# Patient Record
Sex: Female | Born: 1950
Health system: Southern US, Community
[De-identification: ages and names within clinical notes are randomized; demographics above are authoritative.]

## PROBLEM LIST (undated history)

## (undated) DIAGNOSIS — E785 Hyperlipidemia, unspecified: Secondary | ICD-10-CM

## (undated) DIAGNOSIS — I10 Essential (primary) hypertension: Secondary | ICD-10-CM

## (undated) DIAGNOSIS — Z972 Presence of dental prosthetic device (complete) (partial): Secondary | ICD-10-CM

## (undated) DIAGNOSIS — M1990 Unspecified osteoarthritis, unspecified site: Secondary | ICD-10-CM

## (undated) DIAGNOSIS — E039 Hypothyroidism, unspecified: Secondary | ICD-10-CM

## (undated) DIAGNOSIS — R42 Dizziness and giddiness: Secondary | ICD-10-CM

## (undated) HISTORY — DX: Essential (primary) hypertension: I10

## (undated) HISTORY — DX: Hyperlipidemia, unspecified: E78.5

## (undated) HISTORY — PX: BREAST CYST ASPIRATION: SHX578

## (undated) HISTORY — PX: DENTAL SURGERY: SHX609

---

## 2000-08-31 HISTORY — PX: BREAST EXCISIONAL BIOPSY: SUR124

## 2011-07-21 ENCOUNTER — Ambulatory Visit: Payer: Self-pay | Admitting: Family Medicine

## 2012-04-04 DIAGNOSIS — I10 Essential (primary) hypertension: Secondary | ICD-10-CM | POA: Insufficient documentation

## 2012-04-04 DIAGNOSIS — E7800 Pure hypercholesterolemia, unspecified: Secondary | ICD-10-CM | POA: Insufficient documentation

## 2012-04-04 DIAGNOSIS — G5600 Carpal tunnel syndrome, unspecified upper limb: Secondary | ICD-10-CM | POA: Insufficient documentation

## 2013-06-21 ENCOUNTER — Ambulatory Visit: Payer: Self-pay | Admitting: Physician Assistant

## 2013-08-03 ENCOUNTER — Ambulatory Visit: Payer: Self-pay | Admitting: Physician Assistant

## 2013-10-11 ENCOUNTER — Ambulatory Visit: Payer: Self-pay | Admitting: Physician Assistant

## 2014-02-06 ENCOUNTER — Ambulatory Visit: Payer: Self-pay | Admitting: Physician Assistant

## 2014-08-27 ENCOUNTER — Ambulatory Visit: Payer: Self-pay | Admitting: Physician Assistant

## 2015-08-25 IMAGING — MG MM DIGITAL SCREENING BILAT W/ CAD
1 series · 4 of 4 positions shown · non-contrast
Comparison: Previous Exam(s)

REASON FOR EXAM: SCR MAMMO NO ORDER
COMMENTS:

PROCEDURE:     MAM - MAM DGTL SCRN MAM NO ORDER W/CAD  - June 21, 2013  [DATE]
CLINICAL DATA: Screening.
EXAM:
DIGITAL SCREENING BILATERAL MAMMOGRAM WITH CAD

[R CC · right · 4 of 4 slices shown]
[im 1/4]
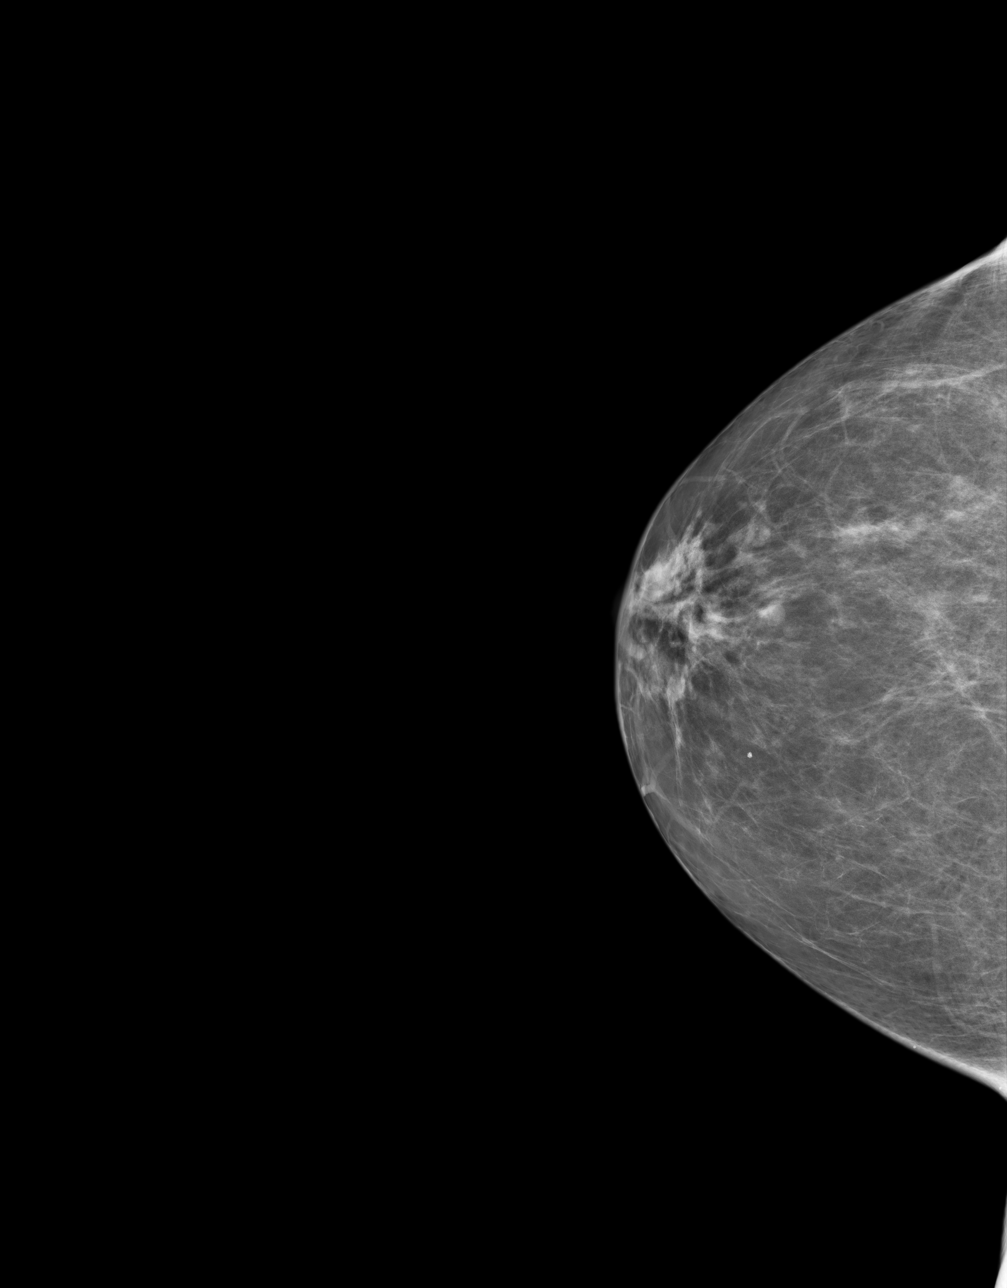
[im 2/4]
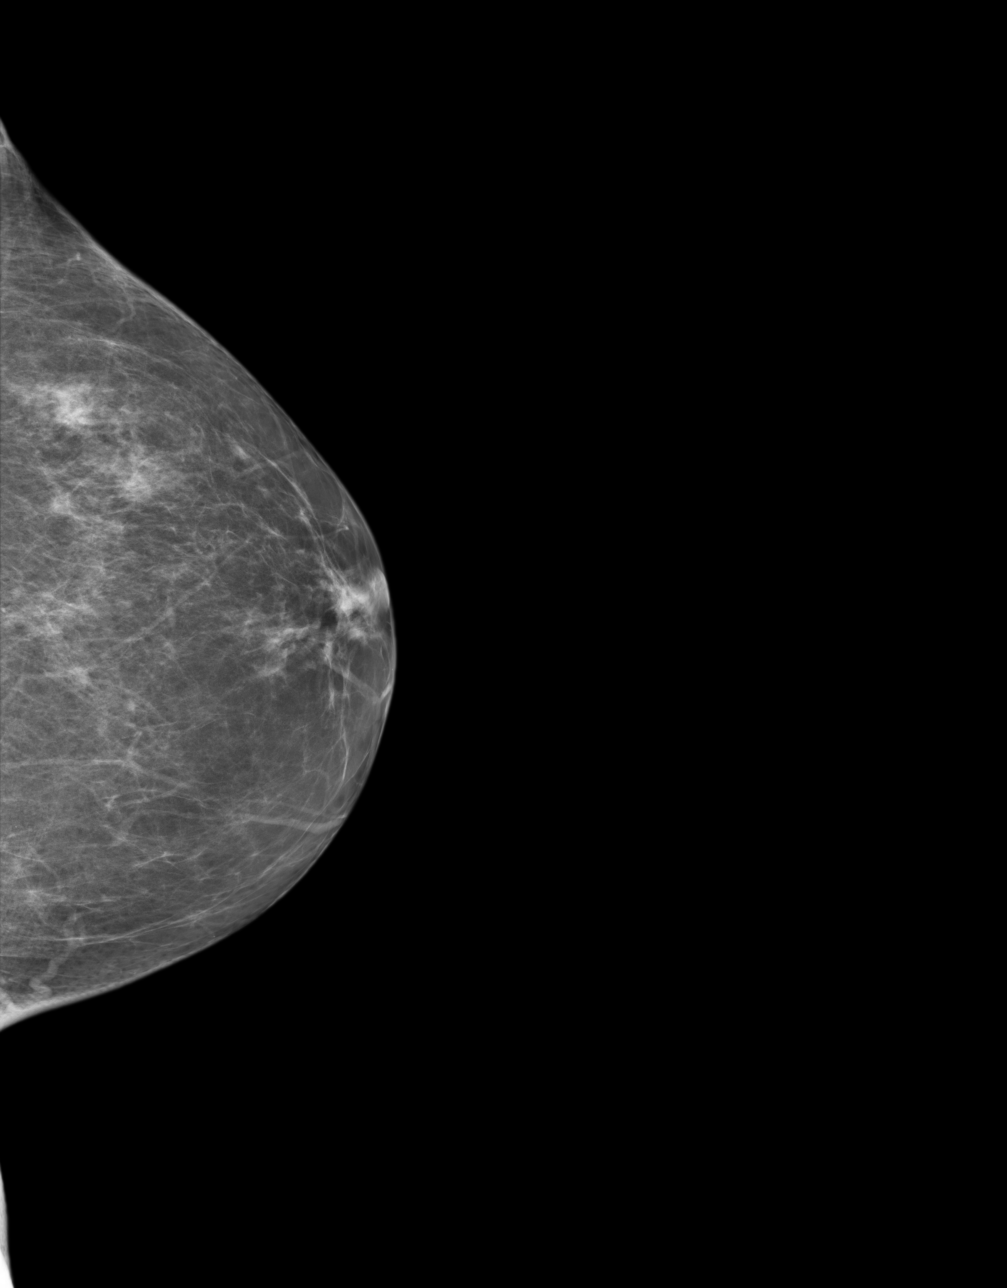
[im 3/4]
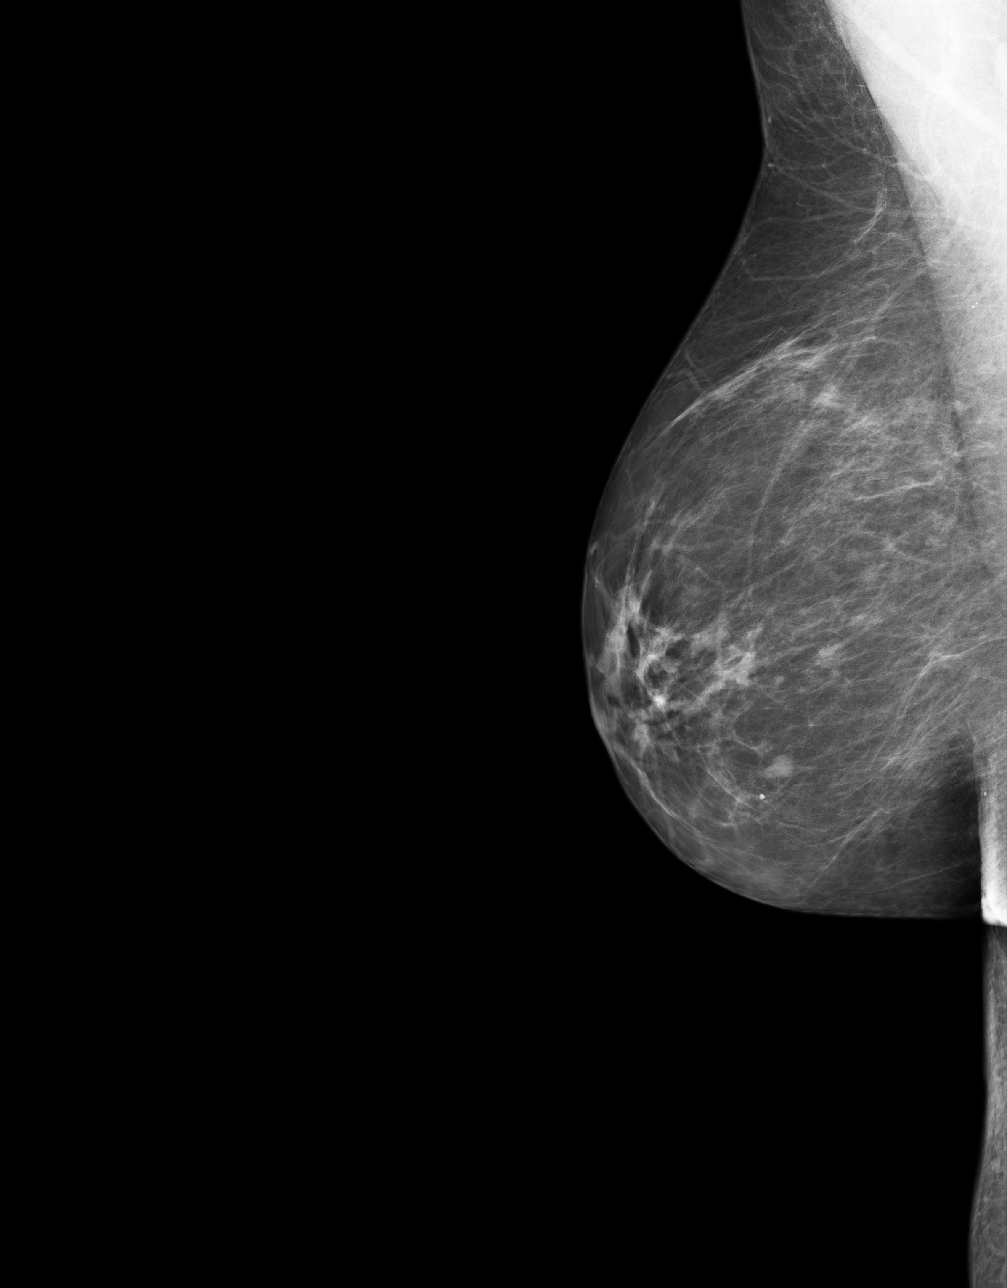
[im 4/4]
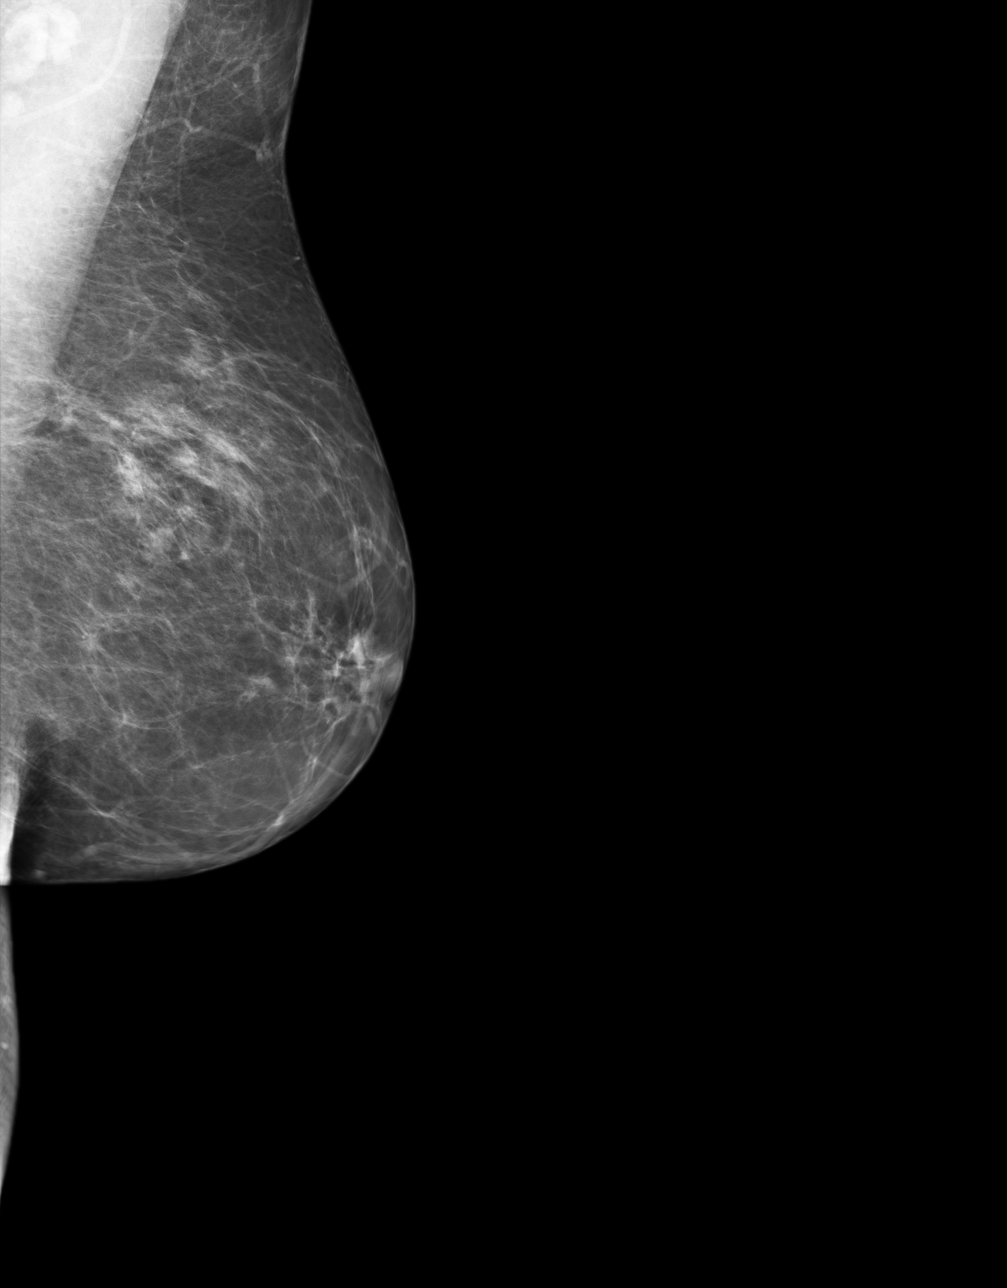

[4 of 4 positions shown; findings below may reference images not displayed]

ACR Breast Density Category b: There are scattered areas of
fibroglandular density.
FINDINGS: In the left breast possible mass warrants further imaging evaluation
with spot compression views and possibly ultrasound. In the right
breast, possible mass warrants further imaging evaluation with spot
compression views and possibly ultrasound. Images were processed
with CAD.
IMPRESSION: Further imaging evaluation is suggested for possible Paulus N the left
breast.

Further imaging evaluation is suggested for possible Paulus N the
right breast.

RECOMMENDATION:
Diagnostic mammogram and possibly ultrasound of both breasts.
(Code:[id number])

The patient will be contacted regarding the findings, and additional
imaging will be scheduled.

BI-RADS CATEGORY  0: Incomplete. Need additional imaging evaluation
and/or prior mammograms for comparison.

## 2016-07-02 DIAGNOSIS — I10 Essential (primary) hypertension: Secondary | ICD-10-CM | POA: Diagnosis not present

## 2016-07-02 DIAGNOSIS — R635 Abnormal weight gain: Secondary | ICD-10-CM | POA: Diagnosis not present

## 2016-07-02 DIAGNOSIS — J0110 Acute frontal sinusitis, unspecified: Secondary | ICD-10-CM | POA: Diagnosis not present

## 2016-07-02 DIAGNOSIS — R42 Dizziness and giddiness: Secondary | ICD-10-CM | POA: Diagnosis not present

## 2016-08-06 DIAGNOSIS — Z6837 Body mass index (BMI) 37.0-37.9, adult: Secondary | ICD-10-CM | POA: Diagnosis not present

## 2016-08-06 DIAGNOSIS — E782 Mixed hyperlipidemia: Secondary | ICD-10-CM | POA: Diagnosis not present

## 2016-08-06 DIAGNOSIS — I10 Essential (primary) hypertension: Secondary | ICD-10-CM | POA: Diagnosis not present

## 2016-10-05 DIAGNOSIS — E669 Obesity, unspecified: Secondary | ICD-10-CM | POA: Diagnosis not present

## 2016-10-05 DIAGNOSIS — I10 Essential (primary) hypertension: Secondary | ICD-10-CM | POA: Diagnosis not present

## 2016-10-05 DIAGNOSIS — E782 Mixed hyperlipidemia: Secondary | ICD-10-CM | POA: Diagnosis not present

## 2016-12-01 DIAGNOSIS — E782 Mixed hyperlipidemia: Secondary | ICD-10-CM | POA: Diagnosis not present

## 2016-12-01 DIAGNOSIS — Z0001 Encounter for general adult medical examination with abnormal findings: Secondary | ICD-10-CM | POA: Diagnosis not present

## 2016-12-01 DIAGNOSIS — E559 Vitamin D deficiency, unspecified: Secondary | ICD-10-CM | POA: Diagnosis not present

## 2016-12-01 DIAGNOSIS — I10 Essential (primary) hypertension: Secondary | ICD-10-CM | POA: Diagnosis not present

## 2016-12-07 ENCOUNTER — Other Ambulatory Visit: Payer: Self-pay | Admitting: Nurse Practitioner

## 2016-12-07 DIAGNOSIS — R87610 Atypical squamous cells of undetermined significance on cytologic smear of cervix (ASC-US): Secondary | ICD-10-CM | POA: Diagnosis not present

## 2016-12-07 DIAGNOSIS — E782 Mixed hyperlipidemia: Secondary | ICD-10-CM | POA: Diagnosis not present

## 2016-12-07 DIAGNOSIS — R10811 Right upper quadrant abdominal tenderness: Secondary | ICD-10-CM | POA: Diagnosis not present

## 2016-12-07 DIAGNOSIS — Z124 Encounter for screening for malignant neoplasm of cervix: Secondary | ICD-10-CM | POA: Diagnosis not present

## 2016-12-07 DIAGNOSIS — Z0001 Encounter for general adult medical examination with abnormal findings: Secondary | ICD-10-CM | POA: Diagnosis not present

## 2016-12-07 DIAGNOSIS — Z1231 Encounter for screening mammogram for malignant neoplasm of breast: Secondary | ICD-10-CM

## 2016-12-07 DIAGNOSIS — E669 Obesity, unspecified: Secondary | ICD-10-CM | POA: Diagnosis not present

## 2016-12-07 DIAGNOSIS — E039 Hypothyroidism, unspecified: Secondary | ICD-10-CM | POA: Diagnosis not present

## 2016-12-07 DIAGNOSIS — I10 Essential (primary) hypertension: Secondary | ICD-10-CM | POA: Diagnosis not present

## 2016-12-31 ENCOUNTER — Ambulatory Visit
Admission: RE | Admit: 2016-12-31 | Discharge: 2016-12-31 | Disposition: A | Discharge status: Home / Self Care | Payer: PPO | Source: Ambulatory Visit | Attending: Nurse Practitioner | Admitting: Nurse Practitioner

## 2016-12-31 ENCOUNTER — Encounter: Payer: Self-pay | Admitting: Radiology

## 2016-12-31 DIAGNOSIS — Z1231 Encounter for screening mammogram for malignant neoplasm of breast: Secondary | ICD-10-CM

## 2017-03-11 DIAGNOSIS — E039 Hypothyroidism, unspecified: Secondary | ICD-10-CM | POA: Diagnosis not present

## 2017-03-18 DIAGNOSIS — E669 Obesity, unspecified: Secondary | ICD-10-CM | POA: Diagnosis not present

## 2017-03-18 DIAGNOSIS — E782 Mixed hyperlipidemia: Secondary | ICD-10-CM | POA: Diagnosis not present

## 2017-03-18 DIAGNOSIS — I10 Essential (primary) hypertension: Secondary | ICD-10-CM | POA: Diagnosis not present

## 2017-03-18 DIAGNOSIS — E039 Hypothyroidism, unspecified: Secondary | ICD-10-CM | POA: Diagnosis not present

## 2017-03-18 DIAGNOSIS — R05 Cough: Secondary | ICD-10-CM | POA: Diagnosis not present

## 2017-06-09 ENCOUNTER — Ambulatory Visit
Admission: RE | Admit: 2017-06-09 | Discharge: 2017-06-09 | Disposition: A | Discharge status: Home / Self Care | Payer: PPO | Source: Ambulatory Visit | Attending: Internal Medicine | Admitting: Internal Medicine

## 2017-06-09 ENCOUNTER — Other Ambulatory Visit: Payer: Self-pay | Admitting: Internal Medicine

## 2017-06-09 DIAGNOSIS — J849 Interstitial pulmonary disease, unspecified: Secondary | ICD-10-CM | POA: Insufficient documentation

## 2017-06-09 DIAGNOSIS — R05 Cough: Secondary | ICD-10-CM | POA: Diagnosis not present

## 2017-06-09 DIAGNOSIS — R059 Cough, unspecified: Secondary | ICD-10-CM

## 2017-06-09 DIAGNOSIS — E669 Obesity, unspecified: Secondary | ICD-10-CM | POA: Diagnosis not present

## 2017-06-09 DIAGNOSIS — I10 Essential (primary) hypertension: Secondary | ICD-10-CM | POA: Diagnosis not present

## 2017-06-09 DIAGNOSIS — K219 Gastro-esophageal reflux disease without esophagitis: Secondary | ICD-10-CM | POA: Diagnosis not present

## 2017-06-09 DIAGNOSIS — E039 Hypothyroidism, unspecified: Secondary | ICD-10-CM | POA: Diagnosis not present

## 2017-06-29 ENCOUNTER — Other Ambulatory Visit: Payer: Self-pay | Admitting: Nurse Practitioner

## 2017-06-29 DIAGNOSIS — R918 Other nonspecific abnormal finding of lung field: Secondary | ICD-10-CM

## 2017-07-05 ENCOUNTER — Ambulatory Visit: Payer: PPO

## 2017-09-07 ENCOUNTER — Other Ambulatory Visit: Payer: Self-pay

## 2017-09-07 ENCOUNTER — Ambulatory Visit: Payer: Self-pay | Admitting: Nurse Practitioner

## 2017-09-07 MED ORDER — OMEPRAZOLE 40 MG PO CPDR: 40.0000 mg | DELAYED_RELEASE_CAPSULE | Freq: Every day | ORAL | 3 refills | Status: DC

## 2017-10-08 ENCOUNTER — Encounter: Payer: Self-pay | Admitting: Nurse Practitioner

## 2017-10-08 ENCOUNTER — Ambulatory Visit (INDEPENDENT_AMBULATORY_CARE_PROVIDER_SITE_OTHER): Payer: PPO | Admitting: Nurse Practitioner

## 2017-10-08 VITALS — BP 135/78 | HR 72 | Resp 16 | Ht 61.5 in | Wt 206.0 lb

## 2017-10-08 DIAGNOSIS — J302 Other seasonal allergic rhinitis: Secondary | ICD-10-CM | POA: Diagnosis not present

## 2017-10-08 DIAGNOSIS — E039 Hypothyroidism, unspecified: Secondary | ICD-10-CM

## 2017-10-08 DIAGNOSIS — E668 Other obesity: Secondary | ICD-10-CM

## 2017-10-08 DIAGNOSIS — E7800 Pure hypercholesterolemia, unspecified: Secondary | ICD-10-CM

## 2017-10-08 DIAGNOSIS — E669 Obesity, unspecified: Secondary | ICD-10-CM

## 2017-10-08 MED ORDER — PHENTERMINE HCL 37.5 MG PO TABS: 37.5000 mg | ORAL_TABLET | Freq: Every day | ORAL | 1 refills | Status: DC

## 2017-10-08 NOTE — Progress Notes (Signed)
[facility] [place], [place] [address]  Internal MEDICINE  Office Visit Note  Patient Name: Savannah Franco  [id number]  [id number]  Date of Service: 10/17/2017  Chief Complaint  Patient presents with  . Allergic Rhinitis     Other  This is a recurrent problem. The current episode started more than 1 month ago. The problem occurs constantly. The problem has been unchanged. Associated symptoms include headaches. Pertinent negatives include no abdominal pain, chest pain, chills, congestion, coughing, fatigue, joint swelling, nausea, neck pain, numbness, rash, sore throat or vomiting. Treatments tried: stimulant weight loss medication, reduced calorie diet, and increased exercise. The treatment provided mild relief.    Pt is here for routine follow up.    Current Medication: Outpatient Encounter Medications as of 10/08/2017  Medication Sig  . aspirin 81 MG EC tablet Take 81 mg by mouth daily. Swallow whole.  Marland Kitchen atorvastatin (LIPITOR) 20 MG tablet Take by mouth.  Marland Kitchen omeprazole (PRILOSEC) 40 MG capsule Take 1 capsule (40 mg total) by mouth daily.  . B Complex Vitamins (VITAMIN B COMPLEX PO) Take by mouth.  . levothyroxine (SYNTHROID, LEVOTHROID) 25 MCG tablet TK 1 T PO D OES  . losartan-hydrochlorothiazide (HYZAAR) 50-12.5 MG tablet TK 1 T PO D  . phentermine (ADIPEX-P) 37.5 MG tablet Take 1 tablet (37.5 mg total) by mouth daily before breakfast.   No facility-administered encounter medications on file as of 10/08/2017.     Surgical History: Past Surgical History:  Procedure Laterality Date  . BREAST CYST ASPIRATION Right    NEG  . BREAST EXCISIONAL BIOPSY Left 2002   NEG    Medical History: Past Medical History:  Diagnosis Date  . Hyperlipidemia   . Hypertension     Family History: Family History  Problem Relation Age of Onset  . Multiple myeloma Mother   . Stroke Father   . Hypertension Father   . Breast cancer Neg Hx     Social History    Socioeconomic History  . Marital status: Divorced    Spouse name: Not on file  . Number of children: Not on file  . Years of education: Not on file  . Highest education level: Not on file  Social Needs  . Financial resource strain: Not on file  . Food insecurity - worry: Not on file  . Food insecurity - inability: Not on file  . Transportation needs - medical: Not on file  . Transportation needs - non-medical: Not on file  Occupational History  . Not on file  Tobacco Use  . Smoking status: Never Smoker  . Smokeless tobacco: Never Used  Substance and Sexual Activity  . Alcohol use: Yes    Comment: social  . Drug use: No  . Sexual activity: Not on file  Other Topics Concern  . Not on file  Social History Narrative  . Not on file      Review of Systems  Constitutional: Negative for activity change, chills, fatigue and unexpected weight change.  HENT: Positive for postnasal drip. Negative for congestion, rhinorrhea, sneezing and sore throat.   Eyes: Negative.  Negative for redness.  Respiratory: Negative for cough, chest tightness, shortness of breath and wheezing.   Cardiovascular: Negative for chest pain and palpitations.  Gastrointestinal: Negative for abdominal pain, constipation, diarrhea, nausea and vomiting.  Endocrine:       Well regulated thyroid   Genitourinary: Negative.  Negative for dysuria and frequency.  Musculoskeletal: Negative for back pain, joint  swelling and neck pain.  Skin: Negative for rash.  Allergic/Immunologic: Positive for environmental allergies.  Neurological: Positive for headaches. Negative for tremors and numbness.  Hematological: Negative for adenopathy. Does not bruise/bleed easily.  Psychiatric/Behavioral: Negative for agitation, behavioral problems (Depression), sleep disturbance and suicidal ideas.    Today's Vitals   10/08/17 1146  BP: 135/78  Pulse: 72  Resp: 16  SpO2: 97%  Weight: 206 lb (93.4 kg)  Height: 5' 1.5" (1.562  m)    Physical Exam  Constitutional: She is oriented to person, place, and time. She appears well-developed and well-nourished. No distress.  HENT:  Head: Normocephalic and atraumatic.  Mouth/Throat: Oropharynx is clear and moist. No oropharyngeal exudate.  Eyes: EOM are normal. Pupils are equal, round, and reactive to light.  Neck: Normal range of motion. Neck supple. No JVD present. No tracheal deviation present. No thyromegaly present.  Cardiovascular: Normal rate, regular rhythm and normal heart sounds. Exam reveals no gallop and no friction rub.  No murmur heard. Pulmonary/Chest: Effort normal and breath sounds normal. No respiratory distress. She has no wheezes. She has no rales. She exhibits no tenderness.  Abdominal: Soft. Bowel sounds are normal. There is no tenderness.  Musculoskeletal: Normal range of motion.  Lymphadenopathy:    She has no cervical adenopathy.  Neurological: She is alert and oriented to person, place, and time. No cranial nerve deficit.  Skin: Skin is warm and dry. She is not diaphoretic.  Psychiatric: She has a normal mood and affect. Her behavior is normal. Judgment and thought content normal.  Nursing note and vitals reviewed.    Assessment/Plan: 1. Moderate obesity Restart phentermine 37.5ng daily. recommennd 1200-1500 calorie diet. Also recommend participation in routine exercise program.  - phentermine (ADIPEX-P) 37.5 MG tablet; Take 1 tablet (37.5 mg total) by mouth daily before breakfast.  Dispense: 30 tablet; Refill: 1  2. Hypercholesterolemia genrally well controlled. Continue cholesterol medication as prescribed.   3. Acquired hypothyroidism Continue levothyroxine as prescribed.   4. Seasonal allergic rhinitis, unspecified trigger Recommended use of OTC claritin or zyrtec every day. If causes fatigue, she should take this at night.    General Counseling: augustina braddock understanding of the findings of todays visit and agrees with plan  of treatment. I have discussed any further diagnostic evaluation that may be needed or ordered today. We also reviewed her medications today. she has been encouraged to call the office with any questions or concerns that should arise related to todays visit.  This patient was seen by Leretha Pol, FNP- C in Collaboration with Dr Lavera Guise as a part of collaborative care agreement   There is a liability release in patients' chart. There has been a 10 minute discussion about the side effects including but not limited to elevated blood pressure, anxiety, lack of sleep and dry mouth. Pt understands and will like to start/continue on appetite suppressant at this time. There will be one month RX given at the time of visit with proper follow up. Nova diet plan with restricted calories is given to the pt. Pt understands and agrees with  plan of treatment  Meds ordered this encounter  Medications  . phentermine (ADIPEX-P) 37.5 MG tablet    Sig: Take 1 tablet (37.5 mg total) by mouth daily before breakfast.    Dispense:  30 tablet    Refill:  1    Order Specific Question:   Supervising Provider    Answer:   Lavera Guise [place]  Time spent: 43 Minutes     Dr Lavera Guise Internal medicine

## 2017-10-17 DIAGNOSIS — E039 Hypothyroidism, unspecified: Secondary | ICD-10-CM | POA: Insufficient documentation

## 2017-10-17 DIAGNOSIS — E668 Other obesity: Secondary | ICD-10-CM | POA: Insufficient documentation

## 2017-10-17 DIAGNOSIS — J302 Other seasonal allergic rhinitis: Secondary | ICD-10-CM | POA: Insufficient documentation

## 2017-11-26 ENCOUNTER — Other Ambulatory Visit: Payer: Self-pay | Admitting: Internal Medicine

## 2017-11-26 ENCOUNTER — Ambulatory Visit: Payer: Self-pay | Admitting: Nurse Practitioner

## 2017-11-26 ENCOUNTER — Encounter: Payer: Self-pay | Admitting: Nurse Practitioner

## 2017-11-26 ENCOUNTER — Ambulatory Visit (INDEPENDENT_AMBULATORY_CARE_PROVIDER_SITE_OTHER): Payer: PPO | Admitting: Nurse Practitioner

## 2017-11-26 VITALS — BP 140/77 | HR 75 | Resp 16 | Ht 60.0 in | Wt 203.0 lb

## 2017-11-26 DIAGNOSIS — E668 Other obesity: Secondary | ICD-10-CM | POA: Diagnosis not present

## 2017-11-26 DIAGNOSIS — J302 Other seasonal allergic rhinitis: Secondary | ICD-10-CM

## 2017-11-26 DIAGNOSIS — I10 Essential (primary) hypertension: Secondary | ICD-10-CM

## 2017-11-26 DIAGNOSIS — E669 Obesity, unspecified: Secondary | ICD-10-CM

## 2017-11-26 DIAGNOSIS — E039 Hypothyroidism, unspecified: Secondary | ICD-10-CM

## 2017-11-26 MED ORDER — PHENTERMINE HCL 37.5 MG PO TABS: 37.5000 mg | ORAL_TABLET | Freq: Every day | ORAL | 1 refills | Status: DC

## 2017-11-26 NOTE — Progress Notes (Signed)
[facility] [place], [place] [address]  Internal MEDICINE  Office Visit Note  Patient Name: Savannah Franco  [id number]  [id number]  Date of Service: 11/26/2017  Chief Complaint  Patient presents with  . Follow-up    weight management program.     The patient is here for routine follow up visit. She is currently taking phentermine 37.79m tablets to help her reduce appetite. She is exercising more frequently and is doing well with diet control. She reports no negative side effects from taking the phentermine   Pt is here for routine follow up.    Current Medication: Outpatient Encounter Medications as of 11/26/2017  Medication Sig  . aspirin 81 MG EC tablet Take 81 mg by mouth daily. Swallow whole.  .Marland Kitchenatorvastatin (LIPITOR) 20 MG tablet Take by mouth.  . B Complex Vitamins (VITAMIN B COMPLEX PO) Take by mouth.  . cholecalciferol (VITAMIN D) 1000 units tablet Take by mouth daily.  .Marland Kitchenlevothyroxine (SYNTHROID, LEVOTHROID) 25 MCG tablet TK 1 T PO D OES  . losartan-hydrochlorothiazide (HYZAAR) 50-12.5 MG tablet TK 1 T PO D  . phentermine (ADIPEX-P) 37.5 MG tablet Take 1 tablet (37.5 mg total) by mouth daily before breakfast.  . [DISCONTINUED] phentermine (ADIPEX-P) 37.5 MG tablet Take 1 tablet (37.5 mg total) by mouth daily before breakfast.  . omeprazole (PRILOSEC) 40 MG capsule Take 1 capsule (40 mg total) by mouth daily. (Patient not taking: Reported on 11/26/2017)   No facility-administered encounter medications on file as of 11/26/2017.     Surgical History: Past Surgical History:  Procedure Laterality Date  . BREAST CYST ASPIRATION Right    NEG  . BREAST EXCISIONAL BIOPSY Left 2002   NEG    Medical History: Past Medical History:  Diagnosis Date  . Hyperlipidemia   . Hypertension     Family History: Family History  Problem Relation Age of Onset  . Multiple myeloma Mother   . Stroke Father   . Hypertension Father   . Breast cancer Neg  Hx     Social History   Socioeconomic History  . Marital status: Divorced    Spouse name: Not on file  . Number of children: Not on file  . Years of education: Not on file  . Highest education level: Not on file  Occupational History  . Not on file  Social Needs  . Financial resource strain: Not on file  . Food insecurity:    Worry: Not on file    Inability: Not on file  . Transportation needs:    Medical: Not on file    Non-medical: Not on file  Tobacco Use  . Smoking status: Former SResearch scientist (life sciences) . Smokeless tobacco: Never Used  Substance and Sexual Activity  . Alcohol use: Yes    Comment: social  . Drug use: No  . Sexual activity: Not on file  Lifestyle  . Physical activity:    Days per week: Not on file    Minutes per session: Not on file  . Stress: Not on file  Relationships  . Social connections:    Talks on phone: Not on file    Gets together: Not on file    Attends religious service: Not on file    Active member of club or organization: Not on file    Attends meetings of clubs or organizations: Not on file    Relationship status: Not on file  . Intimate partner violence:    Fear of current  or ex partner: Not on file    Emotionally abused: Not on file    Physically abused: Not on file    Forced sexual activity: Not on file  Other Topics Concern  . Not on file  Social History Narrative  . Not on file      Review of Systems  Constitutional: Negative for activity change, chills, fatigue and unexpected weight change.       Three pound weight loss since her last visit .  HENT: Positive for postnasal drip. Negative for congestion, rhinorrhea, sneezing and sore throat.   Eyes: Negative.  Negative for redness.  Respiratory: Negative for cough, chest tightness, shortness of breath and wheezing.   Cardiovascular: Negative for chest pain and palpitations.  Gastrointestinal: Negative for abdominal pain, constipation, diarrhea, nausea and vomiting.  Endocrine:        Well regulated thyroid   Genitourinary: Negative.  Negative for dysuria and frequency.  Musculoskeletal: Negative for arthralgias, back pain, joint swelling and neck pain.  Skin: Negative for rash.  Allergic/Immunologic: Positive for environmental allergies.  Neurological: Negative for tremors, numbness and headaches.  Hematological: Negative for adenopathy. Does not bruise/bleed easily.  Psychiatric/Behavioral: Negative for agitation, behavioral problems (Depression), sleep disturbance and suicidal ideas.    Today's Vitals   11/26/17 1051  BP: 140/77  Pulse: 75  Resp: 16  SpO2: 97%  Weight: 203 lb (92.1 kg)  Height: 5' (1.524 m)    Physical Exam  Constitutional: She is oriented to person, place, and time. She appears well-developed and well-nourished. No distress.  HENT:  Head: Normocephalic and atraumatic.  Mouth/Throat: Oropharynx is clear and moist. No oropharyngeal exudate.  Eyes: Pupils are equal, round, and reactive to light. EOM are normal.  Neck: Normal range of motion. Neck supple. No JVD present. No tracheal deviation present. No thyromegaly present.  Cardiovascular: Normal rate, regular rhythm and normal heart sounds. Exam reveals no gallop and no friction rub.  No murmur heard. Pulmonary/Chest: Effort normal and breath sounds normal. No respiratory distress. She has no wheezes. She has no rales. She exhibits no tenderness.  Abdominal: Soft. Bowel sounds are normal. There is no tenderness.  Musculoskeletal: Normal range of motion.  Lymphadenopathy:    She has no cervical adenopathy.  Neurological: She is alert and oriented to person, place, and time. No cranial nerve deficit.  Skin: Skin is warm and dry. She is not diaphoretic.  Psychiatric: She has a normal mood and affect. Her behavior is normal. Judgment and thought content normal.  Nursing note and vitals reviewed.  Assessment/Plan:  1. Moderate obesity Continues to do well on phentermine. May continue  recommend she limit calorie intake to 1200-1500 calories per day and continue with increased exercise.  - phentermine (ADIPEX-P) 37.5 MG tablet; Take 1 tablet (37.5 mg total) by mouth daily before breakfast.  Dispense: 30 tablet; Refill: 1  2. Essential hypertension Stable. Continue BP medication as prescribed   3. Acquired hypothyroidism Stable. Levothyroxine as prescribed   4. Seasonal allergic rhinitis, unspecified trigger Recommend OTC such as clairitin or Zyrtec. Should also consider using nasal spray, like flonase.    General Counseling: aveleen nevers understanding of the findings of todays visit and agrees with plan of treatment. I have discussed any further diagnostic evaluation that may be needed or ordered today. We also reviewed her medications today. she has been encouraged to call the office with any questions or concerns that should arise related to todays visit.    There is a liability  release in patients' chart. There has been a 10 minute discussion about the side effects including but not limited to elevated blood pressure, anxiety, lack of sleep and dry mouth. Pt understands and will like to start/continue on appetite suppressant at this time. There will be one month RX given at the time of visit with proper follow up. Nova diet plan with restricted calories is given to the pt. Pt understands and agrees with  plan of treatment  This patient was seen by Leretha Pol, FNP- C in Collaboration with Dr Lavera Guise as a part of collaborative care agreement  Meds ordered this encounter  Medications  . phentermine (ADIPEX-P) 37.5 MG tablet    Sig: Take 1 tablet (37.5 mg total) by mouth daily before breakfast.    Dispense:  30 tablet    Refill:  1    Order Specific Question:   Supervising Provider    Answer:   Lavera Guise [[id number]]    Time spent: 64 Minutes    Dr Lavera Guise Internal medicine

## 2018-01-07 ENCOUNTER — Encounter: Payer: Self-pay | Admitting: Nurse Practitioner

## 2018-01-07 ENCOUNTER — Ambulatory Visit (INDEPENDENT_AMBULATORY_CARE_PROVIDER_SITE_OTHER): Payer: PPO | Admitting: Nurse Practitioner

## 2018-01-07 VITALS — BP 133/64 | HR 72 | Resp 16 | Ht 60.0 in | Wt 200.4 lb

## 2018-01-07 DIAGNOSIS — K219 Gastro-esophageal reflux disease without esophagitis: Secondary | ICD-10-CM

## 2018-01-07 DIAGNOSIS — E668 Other obesity: Secondary | ICD-10-CM

## 2018-01-07 DIAGNOSIS — E7800 Pure hypercholesterolemia, unspecified: Secondary | ICD-10-CM

## 2018-01-07 DIAGNOSIS — I10 Essential (primary) hypertension: Secondary | ICD-10-CM | POA: Diagnosis not present

## 2018-01-07 DIAGNOSIS — E039 Hypothyroidism, unspecified: Secondary | ICD-10-CM

## 2018-01-07 MED ORDER — PHENTERMINE HCL 37.5 MG PO TABS
37.5000 mg | ORAL_TABLET | Freq: Every day | ORAL | 1 refills | Status: DC
Start: 2018-01-07 — End: 2018-10-14

## 2018-01-07 MED ORDER — LEVOTHYROXINE SODIUM 25 MCG PO TABS: 25.0000 ug | ORAL_TABLET | Freq: Every day | ORAL | 5 refills | Status: DC

## 2018-01-07 MED ORDER — LOSARTAN POTASSIUM-HCTZ 50-12.5 MG PO TABS: 1.0000 | ORAL_TABLET | Freq: Every day | ORAL | 5 refills | Status: DC

## 2018-01-07 MED ORDER — ATORVASTATIN CALCIUM 20 MG PO TABS: 20.0000 mg | ORAL_TABLET | Freq: Every day | ORAL | 5 refills | Status: DC

## 2018-01-07 MED ORDER — OMEPRAZOLE 40 MG PO CPDR: 40.0000 mg | DELAYED_RELEASE_CAPSULE | Freq: Every day | ORAL | 5 refills | Status: DC

## 2018-01-07 NOTE — Progress Notes (Signed)
[facility] [place],  [address]  Internal MEDICINE  Office Visit Note  Patient Name: Savannah Franco  [id number]  [id number]  Date of Service: 01/31/2018  Chief Complaint  Patient presents with  . Weight Loss    6wk follow up    The patient has had a 3 pound weight loss since her last visit. Currently taking phentermine as appetite suppressant. She is limiting calorie intake to 1500 calories per day and she has started exercising 3 to 4 days per week. She has no negative side effects to report.    Pt is here for routine follow up.    Current Medication: Outpatient Encounter Medications as of 01/07/2018  Medication Sig  . aspirin 81 MG EC tablet Take 81 mg by mouth daily. Swallow whole.  Marland Kitchen atorvastatin (LIPITOR) 20 MG tablet Take 1 tablet (20 mg total) by mouth daily.  . B Complex Vitamins (VITAMIN B COMPLEX PO) Take by mouth.  . cholecalciferol (VITAMIN D) 1000 units tablet Take by mouth daily.  Marland Kitchen levothyroxine (SYNTHROID, LEVOTHROID) 25 MCG tablet Take 1 tablet (25 mcg total) by mouth daily before breakfast.  . losartan-hydrochlorothiazide (HYZAAR) 50-12.5 MG tablet Take 1 tablet by mouth daily.  Marland Kitchen omeprazole (PRILOSEC) 40 MG capsule Take 1 capsule (40 mg total) by mouth daily.  . phentermine (ADIPEX-P) 37.5 MG tablet Take 1 tablet (37.5 mg total) by mouth daily before breakfast.  . [DISCONTINUED] atorvastatin (LIPITOR) 20 MG tablet TAKE 1 TABLET BY MOUTH DAILY  . [DISCONTINUED] levothyroxine (SYNTHROID, LEVOTHROID) 25 MCG tablet TAKE 1 TABLET BY MOUTH DAILY ON AN EMPTY STOMACH  . [DISCONTINUED] losartan-hydrochlorothiazide (HYZAAR) 50-12.5 MG tablet TAKE 1 TABLET BY MOUTH DAILY  . [DISCONTINUED] omeprazole (PRILOSEC) 40 MG capsule Take 1 capsule (40 mg total) by mouth daily.  . [DISCONTINUED] phentermine (ADIPEX-P) 37.5 MG tablet Take 1 tablet (37.5 mg total) by mouth daily before breakfast.   No facility-administered encounter medications on  file as of 01/07/2018.     Surgical History: Past Surgical History:  Procedure Laterality Date  . BREAST CYST ASPIRATION Right    NEG  . BREAST EXCISIONAL BIOPSY Left 2002   NEG    Medical History: Past Medical History:  Diagnosis Date  . Hyperlipidemia   . Hypertension     Family History: Family History  Problem Relation Age of Onset  . Multiple myeloma Mother   . Stroke Father   . Hypertension Father   . Breast cancer Neg Hx     Social History   Socioeconomic History  . Marital status: Divorced    Spouse name: Not on file  . Number of children: Not on file  . Years of education: Not on file  . Highest education level: Not on file  Occupational History  . Not on file  Social Needs  . Financial resource strain: Not on file  . Food insecurity:    Worry: Not on file    Inability: Not on file  . Transportation needs:    Medical: Not on file    Non-medical: Not on file  Tobacco Use  . Smoking status: Former Research scientist (life sciences)  . Smokeless tobacco: Never Used  Substance and Sexual Activity  . Alcohol use: Yes    Comment: social  . Drug use: No  . Sexual activity: Not on file  Lifestyle  . Physical activity:    Days per week: Not on file    Minutes per session: Not on file  . Stress: Not on  file  Relationships  . Social connections:    Talks on phone: Not on file    Gets together: Not on file    Attends religious service: Not on file    Active member of club or organization: Not on file    Attends meetings of clubs or organizations: Not on file    Relationship status: Not on file  . Intimate partner violence:    Fear of current or ex partner: Not on file    Emotionally abused: Not on file    Physically abused: Not on file    Forced sexual activity: Not on file  Other Topics Concern  . Not on file  Social History Narrative  . Not on file      Review of Systems  Constitutional: Negative for activity change, chills, fatigue and unexpected weight change.        Three pound weight loss since her last visit .  HENT: Negative for congestion, postnasal drip, rhinorrhea, sneezing and sore throat.   Eyes: Negative.  Negative for redness.  Respiratory: Negative for cough, chest tightness, shortness of breath and wheezing.   Cardiovascular: Negative for chest pain and palpitations.  Gastrointestinal: Negative for abdominal pain, constipation, diarrhea, nausea and vomiting.  Endocrine:       Well regulated thyroid   Genitourinary: Negative.  Negative for dysuria and frequency.  Musculoskeletal: Negative for arthralgias, back pain, joint swelling and neck pain.  Skin: Negative for rash.  Allergic/Immunologic: Positive for environmental allergies.  Neurological: Negative for tremors, numbness and headaches.  Hematological: Negative for adenopathy. Does not bruise/bleed easily.  Psychiatric/Behavioral: Negative for agitation, behavioral problems (Depression), sleep disturbance and suicidal ideas.    Today's Vitals   01/07/18 1131  BP: 133/64  Pulse: 72  Resp: 16  SpO2: 96%  Weight: 200 lb 6.4 oz (90.9 kg)  Height: 5' (1.524 m)    Physical Exam  Constitutional: She is oriented to person, place, and time. She appears well-developed and well-nourished. No distress.  HENT:  Head: Normocephalic and atraumatic.  Mouth/Throat: Oropharynx is clear and moist. No oropharyngeal exudate.  Eyes: Pupils are equal, round, and reactive to light. EOM are normal.  Neck: Normal range of motion. Neck supple. No JVD present. No tracheal deviation present. No thyromegaly present.  Cardiovascular: Normal rate, regular rhythm and normal heart sounds. Exam reveals no gallop and no friction rub.  No murmur heard. Pulmonary/Chest: Effort normal and breath sounds normal. No respiratory distress. She has no wheezes. She has no rales. She exhibits no tenderness.  Abdominal: Soft. Bowel sounds are normal. There is no tenderness.  Musculoskeletal: Normal range of motion.   Lymphadenopathy:    She has no cervical adenopathy.  Neurological: She is alert and oriented to person, place, and time. No cranial nerve deficit.  Skin: Skin is warm and dry. She is not diaphoretic.  Psychiatric: She has a normal mood and affect. Her behavior is normal. Judgment and thought content normal.  Nursing note and vitals reviewed.  Assessment/Plan: 1. Essential hypertension Stable. Continue bp medication as prescribed  - losartan-hydrochlorothiazide (HYZAAR) 50-12.5 MG tablet; Take 1 tablet by mouth daily.  Dispense: 30 tablet; Refill: 5  2. Acquired hypothyroidism Thyroid well controlled. Continue levothyroxine as prescribed.  - levothyroxine (SYNTHROID, LEVOTHROID) 25 MCG tablet; Take 1 tablet (25 mcg total) by mouth daily before breakfast.  Dispense: 30 tablet; Refill: 5  3. Gastroesophageal reflux disease without esophagitis - omeprazole (PRILOSEC) 40 MG capsule; Take 1 capsule (40 mg total) by  mouth daily.  Dispense: 30 capsule; Refill: 5  4. Moderate obesity Doing well. Continue phentermine 37.74m tablets daily. Limit calorie intake to 1200-1500 calories per day. Participate in regular physical exercise.  - phentermine (ADIPEX-P) 37.5 MG tablet; Take 1 tablet (37.5 mg total) by mouth daily before breakfast.  Dispense: 30 tablet; Refill: 1  5. Hypercholesterolemia - atorvastatin (LIPITOR) 20 MG tablet; Take 1 tablet (20 mg total) by mouth daily.  Dispense: 30 tablet; Refill: 5  General Counseling: GKarrisaverbalizes understanding of the findings of todays visit and agrees with plan of treatment. I have discussed any further diagnostic evaluation that may be needed or ordered today. We also reviewed her medications today. she has been encouraged to call the office with any questions or concerns that should arise related to todays visit.    There is a liability release in patients' chart. There has been a 10 minute discussion about the side effects including but not  limited to elevated blood pressure, anxiety, lack of sleep and dry mouth. Pt understands and will like to start/continue on appetite suppressant at this time. There will be one month RX given at the time of visit with proper follow up. Nova diet plan with restricted calories is given to the pt. Pt understands and agrees with  plan of treatment  This patient was seen by HLeretha Pol FNP- C in Collaboration with Dr FLavera Guiseas a part of collaborative care agreement  Meds ordered this encounter  Medications  . phentermine (ADIPEX-P) 37.5 MG tablet    Sig: Take 1 tablet (37.5 mg total) by mouth daily before breakfast.    Dispense:  30 tablet    Refill:  1    Order Specific Question:   Supervising Provider    Answer:   KLavera Guise[[place] . atorvastatin (LIPITOR) 20 MG tablet    Sig: Take 1 tablet (20 mg total) by mouth daily.    Dispense:  30 tablet    Refill:  5    Order Specific Question:   Supervising Provider    Answer:   KLavera Guise[[[id number]] . levothyroxine (SYNTHROID, LEVOTHROID) 25 MCG tablet    Sig: Take 1 tablet (25 mcg total) by mouth daily before breakfast.    Dispense:  30 tablet    Refill:  5    Order Specific Question:   Supervising Provider    Answer:   KLavera Guise[[place] . losartan-hydrochlorothiazide (HYZAAR) 50-12.5 MG tablet    Sig: Take 1 tablet by mouth daily.    Dispense:  30 tablet    Refill:  5    Order Specific Question:   Supervising Provider    Answer:   KLavera Guise[[[id number]] . omeprazole (PRILOSEC) 40 MG capsule    Sig: Take 1 capsule (40 mg total) by mouth daily.    Dispense:  30 capsule    Refill:  5    Order Specific Question:   Supervising Provider    Answer:   KLavera Guise[[[id number]]   Time spent: 146Minutes      Dr FLavera GuiseInternal medicine

## 2018-01-31 DIAGNOSIS — K219 Gastro-esophageal reflux disease without esophagitis: Secondary | ICD-10-CM | POA: Insufficient documentation

## 2018-02-21 ENCOUNTER — Encounter: Payer: PPO | Admitting: Nurse Practitioner

## 2018-06-02 DIAGNOSIS — H25041 Posterior subcapsular polar age-related cataract, right eye: Secondary | ICD-10-CM | POA: Diagnosis not present

## 2018-06-02 DIAGNOSIS — H40053 Ocular hypertension, bilateral: Secondary | ICD-10-CM | POA: Diagnosis not present

## 2018-06-02 DIAGNOSIS — H2513 Age-related nuclear cataract, bilateral: Secondary | ICD-10-CM | POA: Diagnosis not present

## 2018-08-19 ENCOUNTER — Other Ambulatory Visit: Payer: Self-pay

## 2018-08-19 DIAGNOSIS — E7800 Pure hypercholesterolemia, unspecified: Secondary | ICD-10-CM

## 2018-08-19 DIAGNOSIS — E039 Hypothyroidism, unspecified: Secondary | ICD-10-CM

## 2018-08-19 DIAGNOSIS — I10 Essential (primary) hypertension: Secondary | ICD-10-CM

## 2018-08-19 MED ORDER — LOSARTAN POTASSIUM-HCTZ 50-12.5 MG PO TABS: 1.0000 | ORAL_TABLET | Freq: Every day | ORAL | 1 refills | Status: DC

## 2018-08-19 MED ORDER — LEVOTHYROXINE SODIUM 25 MCG PO TABS: 25.0000 ug | ORAL_TABLET | Freq: Every day | ORAL | 1 refills | Status: DC

## 2018-08-19 MED ORDER — ATORVASTATIN CALCIUM 20 MG PO TABS: 20.0000 mg | ORAL_TABLET | Freq: Every day | ORAL | 1 refills | Status: DC

## 2018-08-19 NOTE — Telephone Encounter (Signed)
Lmom pt need follow up appt

## 2018-09-23 ENCOUNTER — Other Ambulatory Visit: Payer: Self-pay | Admitting: Nurse Practitioner

## 2018-09-23 DIAGNOSIS — E7800 Pure hypercholesterolemia, unspecified: Secondary | ICD-10-CM

## 2018-09-23 MED ORDER — ATORVASTATIN CALCIUM 20 MG PO TABS: 20.0000 mg | ORAL_TABLET | Freq: Every day | ORAL | 1 refills | Status: DC

## 2018-10-14 ENCOUNTER — Ambulatory Visit (INDEPENDENT_AMBULATORY_CARE_PROVIDER_SITE_OTHER): Payer: PPO | Admitting: Nurse Practitioner

## 2018-10-14 ENCOUNTER — Encounter: Payer: Self-pay | Admitting: Nurse Practitioner

## 2018-10-14 VITALS — BP 145/81 | HR 79 | Resp 16 | Ht 61.0 in | Wt 222.4 lb

## 2018-10-14 DIAGNOSIS — Z1239 Encounter for other screening for malignant neoplasm of breast: Secondary | ICD-10-CM | POA: Diagnosis not present

## 2018-10-14 DIAGNOSIS — Z13820 Encounter for screening for osteoporosis: Secondary | ICD-10-CM

## 2018-10-14 DIAGNOSIS — E039 Hypothyroidism, unspecified: Secondary | ICD-10-CM

## 2018-10-14 DIAGNOSIS — E668 Other obesity: Secondary | ICD-10-CM | POA: Diagnosis not present

## 2018-10-14 DIAGNOSIS — Z0001 Encounter for general adult medical examination with abnormal findings: Secondary | ICD-10-CM | POA: Diagnosis not present

## 2018-10-14 DIAGNOSIS — M1711 Unilateral primary osteoarthritis, right knee: Secondary | ICD-10-CM

## 2018-10-14 DIAGNOSIS — I10 Essential (primary) hypertension: Secondary | ICD-10-CM | POA: Diagnosis not present

## 2018-10-14 DIAGNOSIS — R300 Dysuria: Secondary | ICD-10-CM | POA: Diagnosis not present

## 2018-10-14 MED ORDER — DICLOFENAC SODIUM 1 % TD GEL: 4.0000 g | Freq: Four times a day (QID) | TRANSDERMAL | 3 refills | Status: AC

## 2018-10-14 MED ORDER — LOSARTAN POTASSIUM-HCTZ 50-12.5 MG PO TABS: 1.0000 | ORAL_TABLET | Freq: Every day | ORAL | 3 refills | Status: DC

## 2018-10-14 MED ORDER — PHENTERMINE HCL 37.5 MG PO TABS: 37.5000 mg | ORAL_TABLET | Freq: Every day | ORAL | 1 refills | Status: DC

## 2018-10-14 NOTE — Progress Notes (Signed)
[facility] [place], [place] [address]  Internal MEDICINE  Office Visit Note  Patient Name: Savannah Franco  [id number]  [id number]  Date of Service: 10/19/2018   Pt is here for routine health maintenance examination  Chief Complaint  Patient presents with  . Medicare Wellness    weight gain due to losing father in Apr 29, 2023, depressed/stressed, pt has a cataract on her right eye  . Weight Gain     The patient is c/o right knee pain. Worse with exertion. Flexion and extension of the right knee increases pain. She does have history of arthritis in right knee. Has seen orthopedics provider in the past.  She is concerned about recent weight gain. Has gained over 20 pounds since she was last seen. Her father passed away in Apr 29, 2023. States that since then, she has definitely been stress eating since the death of her father. She used to take stimulant appetite suppressant in the past. was very successful with weight loss. Had no negative side effects. She would like to restart this if possible. She also feels very fatigued. She does have hypothyroid and needs to have her thyroid checked. Will adjust levothyroxine dosing as indicated.  She has had cataracts removed since her last visit. She is due to have bone density test done as well as screening mammogram.     Current Medication: Outpatient Encounter Medications as of 10/14/2018  Medication Sig  . aspirin 81 MG EC tablet Take 81 mg by mouth daily. Swallow whole.  Marland Kitchen atorvastatin (LIPITOR) 20 MG tablet Take 1 tablet (20 mg total) by mouth daily.  . B Complex Vitamins (VITAMIN B COMPLEX PO) Take by mouth.  . cholecalciferol (VITAMIN D) 1000 units tablet Take by mouth daily.  Marland Kitchen levothyroxine (SYNTHROID, LEVOTHROID) 25 MCG tablet Take 1 tablet (25 mcg total) by mouth daily before breakfast.  . losartan-hydrochlorothiazide (HYZAAR) 50-12.5 MG tablet Take 1 tablet by mouth daily.  Marland Kitchen omeprazole (PRILOSEC) 40 MG capsule  Take 1 capsule (40 mg total) by mouth daily.  . phentermine (ADIPEX-P) 37.5 MG tablet Take 1 tablet (37.5 mg total) by mouth daily before breakfast.  . [DISCONTINUED] losartan-hydrochlorothiazide (HYZAAR) 50-12.5 MG tablet Take 1 tablet by mouth daily.  . [DISCONTINUED] phentermine (ADIPEX-P) 37.5 MG tablet Take 1 tablet (37.5 mg total) by mouth daily before breakfast.  . diclofenac sodium (VOLTAREN) 1 % GEL Apply 4 g topically 4 (four) times daily.   No facility-administered encounter medications on file as of 10/14/2018.     Surgical History: Past Surgical History:  Procedure Laterality Date  . BREAST CYST ASPIRATION Right    NEG  . BREAST EXCISIONAL BIOPSY Left 2002   NEG    Medical History: Past Medical History:  Diagnosis Date  . Hyperlipidemia   . Hypertension     Family History: Family History  Problem Relation Age of Onset  . Multiple myeloma Mother   . Stroke Father   . Hypertension Father   . Breast cancer Neg Hx       Review of Systems  Constitutional: Positive for fatigue. Negative for activity change, chills and unexpected weight change.       Weight gain 22 pounds.   HENT: Negative for congestion, postnasal drip, rhinorrhea, sneezing and sore throat.   Respiratory: Negative for cough, chest tightness and shortness of breath.   Cardiovascular: Negative for chest pain and palpitations.  Gastrointestinal: Negative for abdominal pain, constipation, diarrhea, nausea and vomiting.  Endocrine: Negative for cold intolerance, heat  intolerance, polydipsia and polyuria.  Musculoskeletal: Negative for arthralgias, back pain, joint swelling and neck pain.  Skin: Negative for rash.  Allergic/Immunologic: Negative for environmental allergies.  Neurological: Negative for dizziness, tremors, numbness and headaches.  Hematological: Negative for adenopathy. Does not bruise/bleed easily.  Psychiatric/Behavioral: Negative for behavioral problems (Depression), sleep  disturbance and suicidal ideas. The patient is not nervous/anxious.      Today's Vitals   10/14/18 1114  BP: (!) 145/81  Pulse: 79  Resp: 16  SpO2: 94%  Weight: 222 lb 6.4 oz (100.9 kg)  Height: '5\' 1"'$  (1.549 m)   Body mass index is 42.02 kg/m.  Physical Exam Vitals signs and nursing note reviewed.  Constitutional:      General: She is not in acute distress.    Appearance: Normal appearance. She is well-developed. She is not diaphoretic.  HENT:     Head: Normocephalic and atraumatic.     Mouth/Throat:     Pharynx: No oropharyngeal exudate.  Eyes:     Conjunctiva/sclera: Conjunctivae normal.     Pupils: Pupils are equal, round, and reactive to light.  Neck:     Musculoskeletal: Normal range of motion and neck supple.     Thyroid: No thyromegaly.     Vascular: No carotid bruit or JVD.     Trachea: No tracheal deviation.  Cardiovascular:     Rate and Rhythm: Normal rate and regular rhythm.     Pulses: Normal pulses.     Heart sounds: Normal heart sounds. No murmur. No friction rub. No gallop.   Pulmonary:     Effort: Pulmonary effort is normal. No respiratory distress.     Breath sounds: Normal breath sounds. No wheezing or rales.  Chest:     Chest wall: No tenderness.     Breasts:        Right: Normal. No swelling, bleeding, inverted nipple, mass, nipple discharge, skin change or tenderness.        Left: Normal. No swelling, bleeding, inverted nipple, mass, nipple discharge, skin change or tenderness.  Abdominal:     General: Bowel sounds are normal.     Palpations: Abdomen is soft.     Tenderness: There is no abdominal tenderness.  Musculoskeletal: Normal range of motion.  Lymphadenopathy:     Cervical: No cervical adenopathy.  Skin:    General: Skin is warm and dry.     Capillary Refill: Capillary refill takes less than 2 seconds.  Neurological:     General: No focal deficit present.     Mental Status: She is alert and oriented to person, place, and time.      Cranial Nerves: No cranial nerve deficit.  Psychiatric:        Behavior: Behavior normal.        Thought Content: Thought content normal.        Judgment: Judgment normal.    Depression screen [facility] 2/9 10/14/2018 01/07/2018 11/26/2017 10/08/2017  Decreased Interest 0 0 0 0  Down, Depressed, Hopeless 0 0 0 0  PHQ - 2 Score 0 0 0 0    Functional Status Survey: Is the patient deaf or have difficulty hearing?: No Does the patient have difficulty seeing, even when wearing glasses/contacts?: No Does the patient have difficulty concentrating, remembering, or making decisions?: No Does the patient have difficulty walking or climbing stairs?: No Does the patient have difficulty dressing or bathing?: No Does the patient have difficulty doing errands alone such as visiting a doctor's office or shopping?: No  MMSE - Mini Mental State Exam 10/14/2018  Orientation to time 5  Orientation to Place 5  Registration 3  Attention/ Calculation 5  Recall 3  Language- name 2 objects 2  Language- repeat 1  Language- follow 3 step command 3  Language- read & follow direction 1  Write a sentence 1  Copy design 1  Total score 30    Fall Risk  10/14/2018 01/07/2018 11/26/2017 10/08/2017  Falls in the past year? 1 No Yes No  Number falls in past yr: 0 - 1 -  Injury with Fall? 0 - No -      LABS: Recent Results (from the past 2160 hour(s))  UA/M w/rflx Culture, Routine     Status: None   Collection Time: 10/14/18 10:30 AM  Result Value Ref Range   Specific Gravity, UA 1.005 1.005 - 1.030   pH, UA 7.5 5.0 - 7.5   Color, UA Yellow Yellow   Appearance Ur Clear Clear   Leukocytes, UA Negative Negative   Protein, UA Negative Negative/Trace   Glucose, UA Negative Negative   Ketones, UA Negative Negative   RBC, UA Negative Negative   Bilirubin, UA Negative Negative   Urobilinogen, Ur 0.2 0.2 - 1.0 mg/dL   Nitrite, UA Negative Negative   Microscopic Examination Comment     Comment: Microscopic follows if  indicated.   Microscopic Examination See below:     Comment: Microscopic was indicated and was performed.   Urinalysis Reflex Comment     Comment: This specimen will not reflex to a Urine Culture.  Microscopic Examination     Status: None   Collection Time: 10/14/18 10:30 AM  Result Value Ref Range   WBC, UA None seen 0 - 5 /hpf   RBC, UA None seen 0 - 2 /hpf   Epithelial Cells (non renal) 0-10 0 - 10 /hpf   Casts None seen None seen /lpf   Bacteria, UA None seen None seen/Few   Assessment/Plan: 1. Encounter for general adult medical examination with abnormal findings Annual health maintenance exam today.  2. Essential hypertension Stable. Continue bp medication as prescribed  - losartan-hydrochlorothiazide (HYZAAR) 50-12.5 MG tablet; Take 1 tablet by mouth daily.  Dispense: 90 tablet; Refill: 3  3. Acquired hypothyroidism Check thyroid panel and adjust levothyroxine as indicated.   4. Moderate obesity Restart phentermine 37.'5mg'$  daily. Recommend calorie intake 1200-1500 calories per day. Incorporate exercise into daily routine  - phentermine (ADIPEX-P) 37.5 MG tablet; Take 1 tablet (37.5 mg total) by mouth daily before breakfast.  Dispense: 30 tablet; Refill: 1  5. Primary osteoarthritis of right knee Add diclofenac gel. May be applied to the knee up to four times daily as needed for pain. Will refer back to orthopedics if needed.  - diclofenac sodium (VOLTAREN) 1 % GEL; Apply 4 g topically 4 (four) times daily.  Dispense: 100 g; Refill: 3  6. Screening for osteoporosis - DG Bone Density; Future  7. Screening for breast cancer - MM DIGITAL SCREENING BILATERAL; Future  8. Dysuria - UA/M w/rflx Culture, Routine  General Counseling: Ivelise verbalizes understanding of the findings of todays visit and agrees with plan of treatment. I have discussed any further diagnostic evaluation that may be needed or ordered today. We also reviewed her medications today. she has been  encouraged to call the office with any questions or concerns that should arise related to todays visit.    Counseling:   There is a liability release in patients' chart. There has  been a 10 minute discussion about the side effects including but not limited to elevated blood pressure, anxiety, lack of sleep and dry mouth. Pt understands and will like to start/continue on appetite suppressant at this time. There will be one month RX given at the time of visit with proper follow up. Nova diet plan with restricted calories is given to the pt. Pt understands and agrees with  plan of treatment   This patient was seen by Leretha Pol FNP Collaboration with Dr Lavera Guise as a part of collaborative care agreement  Orders Placed This Encounter  Procedures  . Microscopic Examination  . MM DIGITAL SCREENING BILATERAL  . DG Bone Density  . UA/M w/rflx Culture, Routine    Meds ordered this encounter  Medications  . losartan-hydrochlorothiazide (HYZAAR) 50-12.5 MG tablet    Sig: Take 1 tablet by mouth daily.    Dispense:  90 tablet    Refill:  3    Please fill as 90 day prescription.    Order Specific Question:   Supervising Provider    Answer:   Lavera Guise [[id number]]  . phentermine (ADIPEX-P) 37.5 MG tablet    Sig: Take 1 tablet (37.5 mg total) by mouth daily before breakfast.    Dispense:  30 tablet    Refill:  1    Order Specific Question:   Supervising Provider    Answer:   Lavera Guise [place]  . diclofenac sodium (VOLTAREN) 1 % GEL    Sig: Apply 4 g topically 4 (four) times daily.    Dispense:  100 g    Refill:  3    Order Specific Question:   Supervising Provider    Answer:   Lavera Guise [[id number]]    Time spent: [place], MD  Internal Medicine

## 2018-10-15 LAB — UA/M W/RFLX CULTURE, ROUTINE
Bilirubin, UA: NEGATIVE
Glucose, UA: NEGATIVE
Ketones, UA: NEGATIVE
Leukocytes, UA: NEGATIVE
Nitrite, UA: NEGATIVE
Protein, UA: NEGATIVE
RBC UA: NEGATIVE
Specific Gravity, UA: 1.005 (ref 1.005–1.030)
Urobilinogen, Ur: 0.2 mg/dL (ref 0.2–1.0)
pH, UA: 7.5 (ref 5.0–7.5)

## 2018-10-15 LAB — MICROSCOPIC EXAMINATION
Bacteria, UA: NONE SEEN
Casts: NONE SEEN /lpf
RBC MICROSCOPIC, UA: NONE SEEN /HPF (ref 0–2)
WBC, UA: NONE SEEN /hpf (ref 0–5)

## 2018-10-19 DIAGNOSIS — R300 Dysuria: Secondary | ICD-10-CM | POA: Insufficient documentation

## 2018-10-19 DIAGNOSIS — M1711 Unilateral primary osteoarthritis, right knee: Secondary | ICD-10-CM | POA: Insufficient documentation

## 2018-10-19 DIAGNOSIS — Z1239 Encounter for other screening for malignant neoplasm of breast: Secondary | ICD-10-CM | POA: Insufficient documentation

## 2018-10-25 ENCOUNTER — Other Ambulatory Visit: Payer: Self-pay

## 2018-10-25 DIAGNOSIS — E039 Hypothyroidism, unspecified: Secondary | ICD-10-CM

## 2018-10-25 DIAGNOSIS — I10 Essential (primary) hypertension: Secondary | ICD-10-CM

## 2018-10-25 MED ORDER — LEVOTHYROXINE SODIUM 25 MCG PO TABS: 25.0000 ug | ORAL_TABLET | Freq: Every day | ORAL | 5 refills | Status: DC

## 2018-10-25 MED ORDER — LOSARTAN POTASSIUM-HCTZ 50-12.5 MG PO TABS: 1.0000 | ORAL_TABLET | Freq: Every day | ORAL | 3 refills | Status: DC

## 2018-11-03 ENCOUNTER — Other Ambulatory Visit: Payer: Self-pay | Admitting: Nurse Practitioner

## 2018-11-03 DIAGNOSIS — I10 Essential (primary) hypertension: Secondary | ICD-10-CM | POA: Diagnosis not present

## 2018-11-03 DIAGNOSIS — E559 Vitamin D deficiency, unspecified: Secondary | ICD-10-CM | POA: Diagnosis not present

## 2018-11-03 DIAGNOSIS — Z0001 Encounter for general adult medical examination with abnormal findings: Secondary | ICD-10-CM | POA: Diagnosis not present

## 2018-11-03 DIAGNOSIS — E039 Hypothyroidism, unspecified: Secondary | ICD-10-CM | POA: Diagnosis not present

## 2018-11-04 LAB — COMPREHENSIVE METABOLIC PANEL
ALT: 13 IU/L (ref 0–32)
AST: 14 IU/L (ref 0–40)
Albumin/Globulin Ratio: 2.1 (ref 1.2–2.2)
Albumin: 4.6 g/dL (ref 3.8–4.8)
Alkaline Phosphatase: 74 IU/L (ref 39–117)
BUN/Creatinine Ratio: 24 (ref 12–28)
BUN: 16 mg/dL (ref 8–27)
Bilirubin Total: 0.4 mg/dL (ref 0.0–1.2)
CHLORIDE: 103 mmol/L (ref 96–106)
CO2: 25 mmol/L (ref 20–29)
Calcium: 9.6 mg/dL (ref 8.7–10.3)
Creatinine, Ser: 0.68 mg/dL (ref 0.57–1.00)
GFR calc non Af Amer: 91 mL/min/{1.73_m2} (ref >59)
GFR, EST AFRICAN AMERICAN: 105 mL/min/{1.73_m2} (ref >59)
Globulin, Total: 2.2 g/dL (ref 1.5–4.5)
Glucose: 100 mg/dL — ABNORMAL HIGH (ref 65–99)
Potassium: 4.3 mmol/L (ref 3.5–5.2)
Sodium: 143 mmol/L (ref 134–144)
Total Protein: 6.8 g/dL (ref 6.0–8.5)

## 2018-11-04 LAB — TSH: TSH: 3.26 u[IU]/mL (ref 0.450–4.500)

## 2018-11-04 LAB — LIPID PANEL W/O CHOL/HDL RATIO
Cholesterol, Total: 165 mg/dL (ref 100–199)
HDL: 46 mg/dL (ref >39)
LDL Calculated: 96 mg/dL (ref 0–99)
Triglycerides: 113 mg/dL (ref 0–149)
VLDL Cholesterol Cal: 23 mg/dL (ref 5–40)

## 2018-11-04 LAB — T4, FREE: Free T4: 1.25 ng/dL (ref 0.82–1.77)

## 2018-11-04 LAB — CBC
HEMATOCRIT: 42.7 % (ref 34.0–46.6)
Hemoglobin: 14.7 g/dL (ref 11.1–15.9)
MCH: 32.3 pg (ref 26.6–33.0)
MCHC: 34.4 g/dL (ref 31.5–35.7)
MCV: 94 fL (ref 79–97)
Platelets: 286 10*3/uL (ref 150–450)
RBC: 4.55 x10E6/uL (ref 3.77–5.28)
RDW: 12.5 % (ref 11.7–15.4)
WBC: 5.4 10*3/uL (ref 3.4–10.8)

## 2018-11-04 LAB — VITAMIN D 25 HYDROXY (VIT D DEFICIENCY, FRACTURES): Vit D, 25-Hydroxy: 32.4 ng/mL (ref 30.0–100.0)

## 2018-11-04 LAB — T3: T3, Total: 125 ng/dL (ref 71–180)

## 2018-11-11 ENCOUNTER — Encounter: Payer: Self-pay | Admitting: Adult Health

## 2018-11-11 ENCOUNTER — Ambulatory Visit (INDEPENDENT_AMBULATORY_CARE_PROVIDER_SITE_OTHER): Payer: PPO | Admitting: Adult Health

## 2018-11-11 ENCOUNTER — Other Ambulatory Visit: Payer: Self-pay

## 2018-11-11 VITALS — BP 126/84 | HR 96 | Resp 16 | Ht 61.0 in | Wt 215.0 lb

## 2018-11-11 DIAGNOSIS — Z6841 Body Mass Index (BMI) 40.0 and over, adult: Secondary | ICD-10-CM | POA: Diagnosis not present

## 2018-11-11 DIAGNOSIS — E668 Other obesity: Secondary | ICD-10-CM | POA: Diagnosis not present

## 2018-11-11 DIAGNOSIS — I10 Essential (primary) hypertension: Secondary | ICD-10-CM | POA: Diagnosis not present

## 2018-11-11 DIAGNOSIS — E669 Obesity, unspecified: Secondary | ICD-10-CM

## 2018-11-11 DIAGNOSIS — E039 Hypothyroidism, unspecified: Secondary | ICD-10-CM | POA: Diagnosis not present

## 2018-11-11 MED ORDER — PHENTERMINE HCL 37.5 MG PO TABS: 37.5000 mg | ORAL_TABLET | Freq: Every day | ORAL | 0 refills | Status: DC

## 2018-11-11 NOTE — Progress Notes (Signed)
[facility] [place], [place] [address]  Internal MEDICINE  Office Visit Note  Patient Name: Savannah Franco  [id number]  [id number]  Date of Service: 11/11/2018  Chief Complaint  Patient presents with  . Medical Management of Chronic Issues    weight loss management 4 week follow up     HPI  Pt is here for follow up on HTN, Hypothyroid management of weight loss medication. Patient is using phentermine for weight loss.  Since our last visit they have lost 7 pounds.  The patient denies any chest pain, palpitations, shortness of breath, constipation, headaches or any other side effects of the medication.  Patient wishes to continue to use this medication for weight loss at this time.   Current Medication: Outpatient Encounter Medications as of 11/11/2018  Medication Sig  . aspirin 81 MG EC tablet Take 81 mg by mouth daily. Swallow whole.  Marland Kitchen atorvastatin (LIPITOR) 20 MG tablet Take 1 tablet (20 mg total) by mouth daily.  . B Complex Vitamins (VITAMIN B COMPLEX PO) Take by mouth.  . cholecalciferol (VITAMIN D) 1000 units tablet Take by mouth daily.  . diclofenac sodium (VOLTAREN) 1 % GEL Apply 4 g topically 4 (four) times daily.  Marland Kitchen levothyroxine (SYNTHROID, LEVOTHROID) 25 MCG tablet Take 1 tablet (25 mcg total) by mouth daily before breakfast.  . losartan-hydrochlorothiazide (HYZAAR) 50-12.5 MG tablet Take 1 tablet by mouth daily.  Marland Kitchen omeprazole (PRILOSEC) 40 MG capsule Take 1 capsule (40 mg total) by mouth daily.  . phentermine (ADIPEX-P) 37.5 MG tablet Take 1 tablet (37.5 mg total) by mouth daily before breakfast.  . [DISCONTINUED] phentermine (ADIPEX-P) 37.5 MG tablet Take 1 tablet (37.5 mg total) by mouth daily before breakfast.   No facility-administered encounter medications on file as of 11/11/2018.     Surgical History: Past Surgical History:  Procedure Laterality Date  . BREAST CYST ASPIRATION Right    NEG  . BREAST EXCISIONAL BIOPSY Left 2002    NEG    Medical History: Past Medical History:  Diagnosis Date  . Hyperlipidemia   . Hypertension     Family History: Family History  Problem Relation Age of Onset  . Multiple myeloma Mother   . Stroke Father   . Hypertension Father   . Breast cancer Neg Hx     Social History   Socioeconomic History  . Marital status: Divorced    Spouse name: Not on file  . Number of children: Not on file  . Years of education: Not on file  . Highest education level: Not on file  Occupational History  . Not on file  Social Needs  . Financial resource strain: Not on file  . Food insecurity:    Worry: Not on file    Inability: Not on file  . Transportation needs:    Medical: Not on file    Non-medical: Not on file  Tobacco Use  . Smoking status: Former Research scientist (life sciences)  . Smokeless tobacco: Never Used  Substance and Sexual Activity  . Alcohol use: Yes    Comment: social  . Drug use: No  . Sexual activity: Not on file  Lifestyle  . Physical activity:    Days per week: Not on file    Minutes per session: Not on file  . Stress: Not on file  Relationships  . Social connections:    Talks on phone: Not on file    Gets together: Not on file    Attends religious service:  Not on file    Active member of club or organization: Not on file    Attends meetings of clubs or organizations: Not on file    Relationship status: Not on file  . Intimate partner violence:    Fear of current or ex partner: Not on file    Emotionally abused: Not on file    Physically abused: Not on file    Forced sexual activity: Not on file  Other Topics Concern  . Not on file  Social History Narrative  . Not on file      Review of Systems  Constitutional: Negative for chills, fatigue and unexpected weight change.  HENT: Negative for congestion, rhinorrhea, sneezing and sore throat.   Eyes: Negative for photophobia, pain and redness.  Respiratory: Negative for cough, chest tightness and shortness of breath.    Cardiovascular: Negative for chest pain and palpitations.  Gastrointestinal: Negative for abdominal pain, constipation, diarrhea, nausea and vomiting.  Endocrine: Negative.   Genitourinary: Negative for dysuria and frequency.  Musculoskeletal: Negative for arthralgias, back pain, joint swelling and neck pain.  Skin: Negative for rash.  Allergic/Immunologic: Negative.   Neurological: Negative for tremors and numbness.  Hematological: Negative for adenopathy. Does not bruise/bleed easily.  Psychiatric/Behavioral: Negative for behavioral problems and sleep disturbance. The patient is not nervous/anxious.     Vital Signs: BP 126/84   Pulse 96   Resp 16   Ht _0  (1.549 m)   Wt 215 lb (97.5 kg)   SpO2 94%   BMI 40.62 kg/m    Physical Exam Vitals signs and nursing note reviewed.  Constitutional:      General: She is not in acute distress.    Appearance: She is well-developed. She is not diaphoretic.  HENT:     Head: Normocephalic and atraumatic.     Mouth/Throat:     Pharynx: No oropharyngeal exudate.  Eyes:     Pupils: Pupils are equal, round, and reactive to light.  Neck:     Musculoskeletal: Normal range of motion and neck supple.     Thyroid: No thyromegaly.     Vascular: No JVD.     Trachea: No tracheal deviation.  Cardiovascular:     Rate and Rhythm: Normal rate and regular rhythm.     Heart sounds: Normal heart sounds. No murmur. No friction rub. No gallop.   Pulmonary:     Effort: Pulmonary effort is normal. No respiratory distress.     Breath sounds: Normal breath sounds. No wheezing or rales.  Chest:     Chest wall: No tenderness.  Abdominal:     Palpations: Abdomen is soft.     Tenderness: There is no abdominal tenderness. There is no guarding.  Musculoskeletal: Normal range of motion.  Lymphadenopathy:     Cervical: No cervical adenopathy.  Skin:    General: Skin is warm and dry.  Neurological:     Mental Status: She is alert and oriented to person,  place, and time.     Cranial Nerves: No cranial nerve deficit.  Psychiatric:        Behavior: Behavior normal.        Thought Content: Thought content normal.        Judgment: Judgment normal.    Assessment/Plan: 1. Essential hypertension Stable, continue current medications.   2. Acquired hypothyroidism Stable per current labs.   3. Class 3 severe obesity due to excess calories without serious comorbidity with body mass index (BMI) of 40.0 to 44.9  in adult [facility]) Obesity Counseling: Risk Assessment: An assessment of behavioral risk factors was made today and includes lack of exercise sedentary lifestyle, lack of portion control and poor dietary habits.  Risk Modification Advice: She was counseled on portion control guidelines. Restricting daily caloric intake to. . The detrimental long term effects of obesity on her health and ongoing poor compliance was also discussed with the patient.  There is a liability release in patients' chart. There has been a 10 minute discussion about the side effects including but not limited to elevated blood pressure, anxiety, lack of sleep and dry mouth. Pt understands and will like to start/continue on appetite suppressant at this time. There will be one month RX given at the time of visit with proper follow up. Nova diet plan with restricted calories is given to the pt. Pt understands and agrees with  plan of treatment   General Counseling: janilah hojnacki understanding of the findings of todays visit and agrees with plan of treatment. I have discussed any further diagnostic evaluation that may be needed or ordered today. We also reviewed her medications today. she has been encouraged to call the office with any questions or concerns that should arise related to todays visit.    No orders of the defined types were placed in this encounter.   Meds ordered this encounter  Medications  . phentermine (ADIPEX-P) 37.5 MG tablet    Sig: Take 1 tablet  (37.5 mg total) by mouth daily before breakfast.    Dispense:  30 tablet    Refill:  0    Time spent: 25 Minutes   This patient was seen by Orson Gear AGNP-C in Collaboration with Dr Lavera Guise as a part of collaborative care agreement     Kendell Bane AGNP-C Internal medicine

## 2018-11-11 NOTE — Patient Instructions (Signed)
Phentermine tablets or capsules What is this medicine? PHENTERMINE (FEN ter meen) decreases your appetite. It is used with a reduced calorie diet and exercise to help you lose weight. This medicine may be used for other purposes; ask your health care provider or pharmacist if you have questions. COMMON BRAND NAME(S): Adipex-P, Atti-Plex P, Atti-Plex P Spansule, Fastin, Lomaira, Pro-Fast, Tara-8 What should I tell my health care provider before I take this medicine? They need to know if you have any of these conditions: -agitation or nervousness -diabetes -glaucoma -heart disease -high blood pressure -history of drug abuse or addiction -history of stroke -kidney disease -lung disease called Primary Pulmonary Hypertension (PPH) -taken an MAOI like Carbex, Eldepryl, Marplan, Nardil, or Parnate in last 14 days -taking stimulant medicines for attention disorders, weight loss, or to stay awake -thyroid disease -an unusual or allergic reaction to phentermine, other medicines, foods, dyes, or preservatives -pregnant or trying to get pregnant -breast-feeding How should I use this medicine? Take this medicine by mouth with a glass of water. Follow the directions on the prescription label. The instructions for use may differ based on the product and dose you are taking. Avoid taking this medicine in the evening. It may interfere with sleep. Take your doses at regular intervals. Do not take your medicine more often than directed. Talk to your pediatrician regarding the use of this medicine in children. While this drug may be prescribed for children 17 years or older for selected conditions, precautions do apply. Overdosage: If you think you have taken too much of this medicine contact a poison control center or emergency room at once. NOTE: This medicine is only for you. Do not share this medicine with others. What if I miss a dose? If you miss a dose, take it as soon as you can. If it is almost time  for your next dose, take only that dose. Do not take double or extra doses. What may interact with this medicine? Do not take this medicine with any of the following medications: -MAOIs like Carbex, Eldepryl, Marplan, Nardil, and Parnate -medicines for colds or breathing difficulties like pseudoephedrine or phenylephrine -procarbazine -sibutramine -stimulant medicines for attention disorders, weight loss, or to stay awake This medicine may also interact with the following medications: -certain medicines for depression, anxiety, or psychotic disturbances -linezolid -medicines for diabetes -medicines for high blood pressure This list may not describe all possible interactions. Give your health care provider a list of all the medicines, herbs, non-prescription drugs, or dietary supplements you use. Also tell them if you smoke, drink alcohol, or use illegal drugs. Some items may interact with your medicine. What should I watch for while using this medicine? Notify your physician immediately if you become short of breath while doing your normal activities. Do not take this medicine within 6 hours of bedtime. It can keep you from getting to sleep. Avoid drinks that contain caffeine and try to stick to a regular bedtime every night. This medicine was intended to be used in addition to a healthy diet and exercise. The best results are achieved this way. This medicine is only indicated for short-term use. Eventually your weight loss may level out. At that point, the drug will only help you maintain your new weight. Do not increase or in any way change your dose without consulting your doctor. You may get drowsy or dizzy. Do not drive, use machinery, or do anything that needs mental alertness until you know how this medicine affects you. Do   not stand or sit up quickly, especially if you are an older patient. This reduces the risk of dizzy or fainting spells. Alcohol may increase dizziness and drowsiness.  Avoid alcoholic drinks. What side effects may I notice from receiving this medicine? Side effects that you should report to your doctor or health care professional as soon as possible: -allergic reactions like skin rash, itching or hives, swelling of the face, lips, or tongue) -anxiety -breathing problems -changes in vision -chest pain or chest tightness -depressed mood or other mood changes -hallucinations, loss of contact with reality -fast, irregular heartbeat -increased blood pressure -irritable -nervousness or restlessness -painful urination -palpitations -tremors -trouble sleeping -seizures -signs and symptoms of a stroke like changes in vision; confusion; trouble speaking or understanding; severe headaches; sudden numbness or weakness of the face, arm or leg; trouble walking; dizziness; loss of balance or coordination -unusually weak or tired -vomiting Side effects that usually do not require medical attention (report to your doctor or health care professional if they continue or are bothersome): -constipation or diarrhea -dry mouth -headache -nausea -stomach upset -sweating This list may not describe all possible side effects. Call your doctor for medical advice about side effects. You may report side effects to FDA at 1-800-FDA-1088. Where should I keep my medicine? Keep out of the reach of children. This medicine can be abused. Keep your medicine in a safe place to protect it from theft. Do not share this medicine with anyone. Selling or giving away this medicine is dangerous and against the law. This medicine may cause accidental overdose and death if taken by other adults, children, or pets. Mix any unused medicine with a substance like cat litter or coffee grounds. Then throw the medicine away in a sealed container like a sealed bag or a coffee can with a lid. Do not use the medicine after the expiration date. Store at room temperature between 20 and 25 degrees C (68 and  77 degrees F). Keep container tightly closed. NOTE: This sheet is a summary. It may not cover all possible information. If you have questions about this medicine, talk to your doctor, pharmacist, or health care provider.  2019 Elsevier/Gold Standard (2017-01-29 08:23:13)  

## 2018-12-13 ENCOUNTER — Other Ambulatory Visit: Payer: Self-pay

## 2018-12-13 ENCOUNTER — Ambulatory Visit (INDEPENDENT_AMBULATORY_CARE_PROVIDER_SITE_OTHER): Payer: PPO | Admitting: Nurse Practitioner

## 2018-12-13 ENCOUNTER — Encounter: Payer: Self-pay | Admitting: Nurse Practitioner

## 2018-12-13 VITALS — BP 120/85 | HR 104 | Resp 16 | Ht 61.5 in | Wt 208.0 lb

## 2018-12-13 DIAGNOSIS — I10 Essential (primary) hypertension: Secondary | ICD-10-CM | POA: Diagnosis not present

## 2018-12-13 DIAGNOSIS — E039 Hypothyroidism, unspecified: Secondary | ICD-10-CM

## 2018-12-13 DIAGNOSIS — E668 Other obesity: Secondary | ICD-10-CM | POA: Diagnosis not present

## 2018-12-13 MED ORDER — PHENTERMINE HCL 37.5 MG PO TABS: 37.5000 mg | ORAL_TABLET | Freq: Every day | ORAL | 0 refills | Status: DC

## 2018-12-13 NOTE — Progress Notes (Signed)
[facility] [place], [place] [address]  Internal MEDICINE  Telephone Visit  Patient Name: Savannah Franco  [id number]  [id number]  Date of Service: 12/28/2018  I connected with the patient at 11:35am by webcam and verified the patients identity using two identifiers.   I discussed the limitations, risks, security and privacy concerns of performing an evaluation and management service by webcam and the availability of in person appointments. I also discussed with the patient that there may be a patient responsible charge related to the service.  The patient expressed understanding and agrees to proceed.    Chief Complaint  Patient presents with  . Telephone Assessment  . Telephone Screen  . Medical Management of Chronic Issues    Weight Management   . Hyperlipidemia  . Hypertension    The patient has been contacted via webcam for follow up visit due to concerns for spread of novel coronavirus. The patient is following up for weight management. Currently taking phentermine most days and she reports doing well. Has no negative side effects to report which are assoicciated with this medication. She has hada a seven pound weight loss since she was last seen. Her goal is to get weight under 200pounds. She would like to continue on weight management program.       Current Medication: Outpatient Encounter Medications as of 12/13/2018  Medication Sig  . aspirin 81 MG EC tablet Take 81 mg by mouth daily. Swallow whole.  Marland Kitchen atorvastatin (LIPITOR) 20 MG tablet Take 1 tablet (20 mg total) by mouth daily.  . B Complex Vitamins (VITAMIN B COMPLEX PO) Take by mouth.  . cholecalciferol (VITAMIN D) 1000 units tablet Take by mouth daily.  . diclofenac sodium (VOLTAREN) 1 % GEL Apply 4 g topically 4 (four) times daily.  Marland Kitchen levothyroxine (SYNTHROID, LEVOTHROID) 25 MCG tablet Take 1 tablet (25 mcg total) by mouth daily before breakfast.  . losartan-hydrochlorothiazide (HYZAAR)  50-12.5 MG tablet Take 1 tablet by mouth daily.  Marland Kitchen omeprazole (PRILOSEC) 40 MG capsule Take 1 capsule (40 mg total) by mouth daily.  . phentermine (ADIPEX-P) 37.5 MG tablet Take 1 tablet (37.5 mg total) by mouth daily before breakfast.  . [DISCONTINUED] phentermine (ADIPEX-P) 37.5 MG tablet Take 1 tablet (37.5 mg total) by mouth daily before breakfast.   No facility-administered encounter medications on file as of 12/13/2018.     Surgical History: Past Surgical History:  Procedure Laterality Date  . BREAST CYST ASPIRATION Right    NEG  . BREAST EXCISIONAL BIOPSY Left 2002   NEG    Medical History: Past Medical History:  Diagnosis Date  . Hyperlipidemia   . Hypertension     Family History: Family History  Problem Relation Age of Onset  . Multiple myeloma Mother   . Stroke Father   . Hypertension Father   . Breast cancer Neg Hx     Social History   Socioeconomic History  . Marital status: Divorced    Spouse name: Not on file  . Number of children: Not on file  . Years of education: Not on file  . Highest education level: Not on file  Occupational History  . Not on file  Social Needs  . Financial resource strain: Not on file  . Food insecurity:    Worry: Not on file    Inability: Not on file  . Transportation needs:    Medical: Not on file    Non-medical: Not on file  Tobacco Use  .  Smoking status: Former Research scientist (life sciences)  . Smokeless tobacco: Never Used  Substance and Sexual Activity  . Alcohol use: Yes    Comment: social  . Drug use: No  . Sexual activity: Not on file  Lifestyle  . Physical activity:    Days per week: Not on file    Minutes per session: Not on file  . Stress: Not on file  Relationships  . Social connections:    Talks on phone: Not on file    Gets together: Not on file    Attends religious service: Not on file    Active member of club or organization: Not on file    Attends meetings of clubs or organizations: Not on file    Relationship  status: Not on file  . Intimate partner violence:    Fear of current or ex partner: Not on file    Emotionally abused: Not on file    Physically abused: Not on file    Forced sexual activity: Not on file  Other Topics Concern  . Not on file  Social History Narrative  . Not on file      Review of Systems  Constitutional: Negative for chills, fatigue and unexpected weight change.       Seven pound weight loss since her last visit.  HENT: Negative for congestion, rhinorrhea, sneezing and sore throat.   Respiratory: Negative for cough, chest tightness and shortness of breath.   Cardiovascular: Negative for chest pain and palpitations.  Gastrointestinal: Negative for abdominal pain, constipation, diarrhea, nausea and vomiting.  Musculoskeletal: Negative for arthralgias, back pain, joint swelling and neck pain.  Skin: Negative for rash.  Neurological: Negative for dizziness, tremors, numbness and headaches.  Hematological: Negative for adenopathy. Does not bruise/bleed easily.  Psychiatric/Behavioral: Negative for behavioral problems and sleep disturbance. The patient is not nervous/anxious.     Today's Vitals   12/13/18 0937  BP: 120/85  Pulse: (!) 104  Resp: 16  Weight: 208 lb (94.3 kg)  Height: 5' 1.5" (1.562 m)   Body mass index is 38.66 kg/m.  Observation/Objective: The patient is alert and oriented .she is pleasant and answers all questions appropriately. She is in no acute distress at this time.   Assessment/Plan: 1. Essential hypertension Well controlled. Continue bp medication as prescribed   2. Acquired hypothyroidism Continue levothyroxine as prescribed   3. Moderate obesity Improving. Continue phentermine 37.39m tablets every day. Limit calorie intake to 1200 calories each day. Continue to incorporate exercise into daily routine.  - phentermine (ADIPEX-P) 37.5 MG tablet; Take 1 tablet (37.5 mg total) by mouth daily before breakfast.  Dispense: 30 tablet;  Refill: 0  General Counseling: GJerusalemverbalizes understanding of the findings of today's phone visit and agrees with plan of treatment. I have discussed any further diagnostic evaluation that may be needed or ordered today. We also reviewed her medications today. she has been encouraged to call the office with any questions or concerns that should arise related to todays visit.   There is a liability release in patients' chart. There has been a 10 minute discussion about the side effects including but not limited to elevated blood pressure, anxiety, lack of sleep and dry mouth. Pt understands and will like to start/continue on appetite suppressant at this time. There will be one month RX given at the time of visit with proper follow up. Nova diet plan with restricted calories is given to the pt. Pt understands and agrees with  plan of treatment  This  patient was seen by Leretha Pol FNP Collaboration with Dr Lavera Guise as a part of collaborative care agreement  Meds ordered this encounter  Medications  . phentermine (ADIPEX-P) 37.5 MG tablet    Sig: Take 1 tablet (37.5 mg total) by mouth daily before breakfast.    Dispense:  30 tablet    Refill:  0    Order Specific Question:   Supervising Provider    Answer:   Lavera Guise [[id number]]    Time spent: 68 Minutes    Dr Lavera Guise Internal medicine

## 2019-01-12 ENCOUNTER — Encounter: Payer: Self-pay | Admitting: Nurse Practitioner

## 2019-01-12 ENCOUNTER — Other Ambulatory Visit: Payer: Self-pay

## 2019-01-12 ENCOUNTER — Ambulatory Visit (INDEPENDENT_AMBULATORY_CARE_PROVIDER_SITE_OTHER): Payer: PPO | Admitting: Nurse Practitioner

## 2019-01-12 VITALS — BP 132/75 | HR 80 | Ht 61.5 in | Wt 208.6 lb

## 2019-01-12 DIAGNOSIS — I10 Essential (primary) hypertension: Secondary | ICD-10-CM

## 2019-01-12 DIAGNOSIS — E668 Other obesity: Secondary | ICD-10-CM

## 2019-01-12 DIAGNOSIS — E039 Hypothyroidism, unspecified: Secondary | ICD-10-CM

## 2019-01-12 NOTE — Progress Notes (Signed)
[facility] [place], [place] [address]  Internal MEDICINE  Telephone Visit  Patient Name: Savannah Franco  [id number]  [id number]  Date of Service: 01/29/2019  I connected with the patient at 12:48pm by telephone and verified the patients identity using two identifiers.   I discussed the limitations, risks, security and privacy concerns of performing an evaluation and management service by telephone and the availability of in person appointments. I also discussed with the patient that there may be a patient responsible charge related to the service.  The patient expressed understanding and agrees to proceed.    Chief Complaint  Patient presents with  . Telephone Screen  . Telephone Assessment  . Medical Management of Chronic Issues    weight management     .The patient has been contacted via telephone for follow up visit due to concerns for spread of novel coronavirus.  The patient is following up for weight management. Currently taking phentermine most days and she reports doing well. Has no negative side effects to report which are assoicciated with this medication. She has no weight gain or weight loss since she was last seen. Her goal is to get weight under 200pounds. She would like to continue on weight management program.        Current Medication: Outpatient Encounter Medications as of 01/12/2019  Medication Sig  . aspirin 81 MG EC tablet Take 81 mg by mouth daily. Swallow whole.  Marland Kitchen atorvastatin (LIPITOR) 20 MG tablet Take 1 tablet (20 mg total) by mouth daily.  . B Complex Vitamins (VITAMIN B COMPLEX PO) Take by mouth.  . cholecalciferol (VITAMIN D) 1000 units tablet Take by mouth daily.  . diclofenac sodium (VOLTAREN) 1 % GEL Apply 4 g topically 4 (four) times daily.  Marland Kitchen levothyroxine (SYNTHROID, LEVOTHROID) 25 MCG tablet Take 1 tablet (25 mcg total) by mouth daily before breakfast.  . losartan-hydrochlorothiazide (HYZAAR) 50-12.5 MG tablet Take  1 tablet by mouth daily.  Marland Kitchen omeprazole (PRILOSEC) 40 MG capsule Take 1 capsule (40 mg total) by mouth daily.  . [DISCONTINUED] phentermine (ADIPEX-P) 37.5 MG tablet Take 1 tablet (37.5 mg total) by mouth daily before breakfast.   No facility-administered encounter medications on file as of 01/12/2019.     Surgical History: Past Surgical History:  Procedure Laterality Date  . BREAST CYST ASPIRATION Right    NEG  . BREAST EXCISIONAL BIOPSY Left 2002   NEG    Medical History: Past Medical History:  Diagnosis Date  . Hyperlipidemia   . Hypertension     Family History: Family History  Problem Relation Age of Onset  . Multiple myeloma Mother   . Stroke Father   . Hypertension Father   . Breast cancer Neg Hx     Social History   Socioeconomic History  . Marital status: Divorced    Spouse name: Not on file  . Number of children: Not on file  . Years of education: Not on file  . Highest education level: Not on file  Occupational History  . Not on file  Social Needs  . Financial resource strain: Not on file  . Food insecurity:    Worry: Not on file    Inability: Not on file  . Transportation needs:    Medical: Not on file    Non-medical: Not on file  Tobacco Use  . Smoking status: Former Research scientist (life sciences)  . Smokeless tobacco: Never Used  Substance and Sexual Activity  . Alcohol use: Yes  Comment: social  . Drug use: No  . Sexual activity: Not on file  Lifestyle  . Physical activity:    Days per week: Not on file    Minutes per session: Not on file  . Stress: Not on file  Relationships  . Social connections:    Talks on phone: Not on file    Gets together: Not on file    Attends religious service: Not on file    Active member of club or organization: Not on file    Attends meetings of clubs or organizations: Not on file    Relationship status: Not on file  . Intimate partner violence:    Fear of current or ex partner: Not on file    Emotionally abused: Not on file     Physically abused: Not on file    Forced sexual activity: Not on file  Other Topics Concern  . Not on file  Social History Narrative  . Not on file      Review of Systems  Constitutional: Negative for chills, fatigue and unexpected weight change.       No weight gain or weight loss.   HENT: Negative for congestion, rhinorrhea, sneezing and sore throat.   Respiratory: Negative for cough, chest tightness and shortness of breath.   Cardiovascular: Negative for chest pain and palpitations.  Gastrointestinal: Negative for abdominal pain, constipation, diarrhea, nausea and vomiting.  Musculoskeletal: Negative for arthralgias, back pain, joint swelling and neck pain.  Skin: Negative for rash.  Neurological: Negative for dizziness, tremors, numbness and headaches.  Hematological: Negative for adenopathy. Does not bruise/bleed easily.  Psychiatric/Behavioral: Negative for behavioral problems and sleep disturbance. The patient is not nervous/anxious.     Today's Vitals   01/12/19 1200  BP: 132/75  Pulse: 80  Weight: 208 lb 9.6 oz (94.6 kg)  Height: 5' 1.5" (1.562 m)   Body mass index is 38.78 kg/m.  Observation/Objective:   The patient is alert and oriented. She is pleasant and answers all questions appropriately. Breathing is non-labored. She is in no acute distress at this time.    Assessment/Plan:  1. Essential hypertension Stable. Continue bp medication as prescribed   2. Acquired hypothyroidism Continue levothyroxine as prescribed   3. Moderate obesity May continue phentermine 37.56m tablets daily. Limit calorei intake to 1200 calories per day. Incorporate exercise into daily routine.   General Counseling: Gdeirdra heumannunderstanding of the findings of today's phone visit and agrees with plan of treatment. I have discussed any further diagnostic evaluation that may be needed or ordered today. We also reviewed her medications today. she has been encouraged to call  the office with any questions or concerns that should arise related to todays visit.   There is a liability release in patients' chart. There has been a 10 minute discussion about the side effects including but not limited to elevated blood pressure, anxiety, lack of sleep and dry mouth. Pt understands and will like to start/continue on appetite suppressant at this time. There will be one month RX given at the time of visit with proper follow up. Nova diet plan with restricted calories is given to the pt. Pt understands and agrees with  plan of treatment  This patient was seen by HLeretha PolFNP Collaboration with Dr FLavera Guiseas a part of collaborative care agreement   Time spent: 25 Minutes     Dr FLavera GuiseInternal medicine

## 2019-01-13 ENCOUNTER — Other Ambulatory Visit: Payer: Self-pay | Admitting: Nurse Practitioner

## 2019-01-13 ENCOUNTER — Telehealth: Payer: Self-pay

## 2019-01-13 DIAGNOSIS — E668 Other obesity: Secondary | ICD-10-CM

## 2019-01-13 MED ORDER — PHENTERMINE HCL 37.5 MG PO TABS: 37.5000 mg | ORAL_TABLET | Freq: Every day | ORAL | 0 refills | Status: DC

## 2019-01-13 NOTE — Telephone Encounter (Signed)
I thought I had done this at her visit, but I guess I did not. Sent new rx for phentermine to her pharmacy.

## 2019-01-13 NOTE — Telephone Encounter (Signed)
Pt was notified.  

## 2019-01-13 NOTE — Progress Notes (Signed)
Sent new rx for phentermine to her pharmacy.

## 2019-02-09 ENCOUNTER — Ambulatory Visit (INDEPENDENT_AMBULATORY_CARE_PROVIDER_SITE_OTHER): Payer: PPO | Admitting: Nurse Practitioner

## 2019-02-09 ENCOUNTER — Encounter: Payer: Self-pay | Admitting: Nurse Practitioner

## 2019-02-09 ENCOUNTER — Other Ambulatory Visit: Payer: Self-pay

## 2019-02-09 VITALS — BP 133/75 | HR 76 | Ht 61.0 in | Wt 207.0 lb

## 2019-02-09 DIAGNOSIS — E668 Other obesity: Secondary | ICD-10-CM | POA: Diagnosis not present

## 2019-02-09 DIAGNOSIS — I10 Essential (primary) hypertension: Secondary | ICD-10-CM | POA: Diagnosis not present

## 2019-02-09 MED ORDER — PHENTERMINE HCL 37.5 MG PO TABS: 37.5000 mg | ORAL_TABLET | Freq: Every day | ORAL | 1 refills | Status: DC

## 2019-02-09 NOTE — Progress Notes (Signed)
[facility] [place], [place] [address]  Internal MEDICINE  Telephone Visit  Patient Name: Savannah Franco  [id number]  [id number]  Date of Service: 02/09/2019  I connected with the patient at 12:12pm by telephone and verified the patients identity using two identifiers.   I discussed the limitations, risks, security and privacy concerns of performing an evaluation and management service by telephone and the availability of in person appointments. I also discussed with the patient that there may be a patient responsible charge related to the service.  The patient expressed understanding and agrees to proceed.    Chief Complaint  Patient presents with  . Telephone Screen    VIDEO VISIT [phone]  . Telephone Assessment  . Medical Management of Chronic Issues    4wk follow up weight management    The patient has been contacted via telephone for follow up visit due to concerns for spread of novel coronavirus. The patient is following. Currently taking phentermine most days and she reports doing well.  She has had a one pound weight loss since she was last seen. Her goal is to get weight under 200pounds. She would like to continue on weight management program.   She hsa had some mild constipation with phentermine but, otherwise, she has had no negative side effects.         Current Medication: Outpatient Encounter Medications as of 02/09/2019  Medication Sig  . aspirin 81 MG EC tablet Take 81 mg by mouth daily. Swallow whole.  Marland Kitchen atorvastatin (LIPITOR) 20 MG tablet Take 1 tablet (20 mg total) by mouth daily.  . B Complex Vitamins (VITAMIN B COMPLEX PO) Take by mouth.  . cholecalciferol (VITAMIN D) 1000 units tablet Take by mouth daily.  . diclofenac sodium (VOLTAREN) 1 % GEL Apply 4 g topically 4 (four) times daily.  Marland Kitchen levothyroxine (SYNTHROID, LEVOTHROID) 25 MCG tablet Take 1 tablet (25 mcg total) by mouth daily before breakfast.  .  losartan-hydrochlorothiazide (HYZAAR) 50-12.5 MG tablet Take 1 tablet by mouth daily.  Marland Kitchen omeprazole (PRILOSEC) 40 MG capsule Take 1 capsule (40 mg total) by mouth daily.  . phentermine (ADIPEX-P) 37.5 MG tablet Take 1 tablet (37.5 mg total) by mouth daily before breakfast.  . [DISCONTINUED] phentermine (ADIPEX-P) 37.5 MG tablet Take 1 tablet (37.5 mg total) by mouth daily before breakfast.   No facility-administered encounter medications on file as of 02/09/2019.     Surgical History: Past Surgical History:  Procedure Laterality Date  . BREAST CYST ASPIRATION Right    NEG  . BREAST EXCISIONAL BIOPSY Left 2002   NEG    Medical History: Past Medical History:  Diagnosis Date  . Hyperlipidemia   . Hypertension     Family History: Family History  Problem Relation Age of Onset  . Multiple myeloma Mother   . Stroke Father   . Hypertension Father   . Breast cancer Neg Hx     Social History   Socioeconomic History  . Marital status: Divorced    Spouse name: Not on file  . Number of children: Not on file  . Years of education: Not on file  . Highest education level: Not on file  Occupational History  . Not on file  Social Needs  . Financial resource strain: Not on file  . Food insecurity    Worry: Not on file    Inability: Not on file  . Transportation needs    Medical: Not on file    Non-medical:  Not on file  Tobacco Use  . Smoking status: Former Research scientist (life sciences)  . Smokeless tobacco: Never Used  Substance and Sexual Activity  . Alcohol use: Yes    Comment: social  . Drug use: No  . Sexual activity: Not on file  Lifestyle  . Physical activity    Days per week: Not on file    Minutes per session: Not on file  . Stress: Not on file  Relationships  . Social Herbalist on phone: Not on file    Gets together: Not on file    Attends religious service: Not on file    Active member of club or organization: Not on file    Attends meetings of clubs or organizations:  Not on file    Relationship status: Not on file  . Intimate partner violence    Fear of current or ex partner: Not on file    Emotionally abused: Not on file    Physically abused: Not on file    Forced sexual activity: Not on file  Other Topics Concern  . Not on file  Social History Narrative  . Not on file      Review of Systems  Constitutional: Negative for chills, fatigue and unexpected weight change.       One pound weight loss since her last visit   HENT: Negative for congestion, rhinorrhea, sneezing and sore throat.   Respiratory: Negative for cough, chest tightness and shortness of breath.   Cardiovascular: Negative for chest pain and palpitations.  Gastrointestinal: Negative for abdominal pain, constipation, diarrhea, nausea and vomiting.  Musculoskeletal: Negative for arthralgias, back pain, joint swelling and neck pain.  Skin: Negative for rash.  Neurological: Negative for dizziness, tremors, numbness and headaches.  Hematological: Negative for adenopathy. Does not bruise/bleed easily.  Psychiatric/Behavioral: Negative for behavioral problems and sleep disturbance. The patient is not nervous/anxious.     Today's Vitals   02/09/19 1121  BP: 133/75  Pulse: 76  Weight: 207 lb (93.9 kg)  Height: 5' 1"  (1.549 m)   Body mass index is 39.11 kg/m.  Observation/Objective:    The patient is alert and oriented. She is pleasant and answers all questions appropriately. Breathing is non-labored. She is in no acute distress at this time.    Assessment/Plan: 1. Essential hypertension Stable. Continue bp medication as prescribed   2. Moderate obesity Improving. Continue phentermine daily. Recommend she increase water intake and try using mild stool softener for constipation. Should limit calorie intake to 1200-1500 calories per day. Incorporate exercise into daily routine.  - phentermine (ADIPEX-P) 37.5 MG tablet; Take 1 tablet (37.5 mg total) by mouth daily before  breakfast.  Dispense: 30 tablet; Refill: 1  General Counseling: Savannah Franco verbalizes understanding of the findings of today's phone visit and agrees with plan of treatment. I have discussed any further diagnostic evaluation that may be needed or ordered today. We also reviewed her medications today. she has been encouraged to call the office with any questions or concerns that should arise related to todays visit.   There is a liability release in patients' chart. There has been a 10 minute discussion about the side effects including but not limited to elevated blood pressure, anxiety, lack of sleep and dry mouth. Pt understands and will like to start/continue on appetite suppressant at this time. There will be one month RX given at the time of visit with proper follow up. Nova diet plan with restricted calories is given to the pt.  Pt understands and agrees with  plan of treatment  This patient was seen by Leretha Pol FNP Collaboration with Dr Lavera Guise as a part of collaborative care agreement  Meds ordered this encounter  Medications  . phentermine (ADIPEX-P) 37.5 MG tablet    Sig: Take 1 tablet (37.5 mg total) by mouth daily before breakfast.    Dispense:  30 tablet    Refill:  1    Order Specific Question:   Supervising Provider    Answer:   Lavera Guise [[id number]]    Time spent: 30 Minutes    Dr Lavera Guise Internal medicine

## 2019-03-09 ENCOUNTER — Other Ambulatory Visit: Payer: Self-pay

## 2019-03-09 ENCOUNTER — Ambulatory Visit (INDEPENDENT_AMBULATORY_CARE_PROVIDER_SITE_OTHER): Payer: PPO | Admitting: Adult Health

## 2019-03-09 ENCOUNTER — Encounter: Payer: Self-pay | Admitting: Adult Health

## 2019-03-09 VITALS — BP 146/76 | HR 90 | Temp 99.0°F | Resp 16 | Ht 61.5 in | Wt 203.0 lb

## 2019-03-09 DIAGNOSIS — E668 Other obesity: Secondary | ICD-10-CM

## 2019-03-09 DIAGNOSIS — I10 Essential (primary) hypertension: Secondary | ICD-10-CM

## 2019-03-09 DIAGNOSIS — K219 Gastro-esophageal reflux disease without esophagitis: Secondary | ICD-10-CM

## 2019-03-09 DIAGNOSIS — Z6841 Body Mass Index (BMI) 40.0 and over, adult: Secondary | ICD-10-CM

## 2019-03-09 MED ORDER — PHENTERMINE HCL 37.5 MG PO TABS: 37.5000 mg | ORAL_TABLET | Freq: Every day | ORAL | 0 refills | Status: DC

## 2019-03-09 NOTE — Progress Notes (Signed)
[facility] [place], [place] [address]  Internal MEDICINE  Office Visit Note  Patient Name: Savannah Franco  [id number]  [id number]  Date of Service: 03/09/2019  Chief Complaint  Patient presents with  . Medical Management of Chronic Issues    4wk follow up weight management    HPI  Patient is using phentermine for weight loss.  Since our last visit they have lost 4 pounds.  The patient denies any chest pain, palpitations, shortness of breath, constipation, headaches or any other side effects of the medication.  Patient wishes to continue to use this medication for weight loss at this time.   Current Medication: Outpatient Encounter Medications as of 03/09/2019  Medication Sig  . aspirin 81 MG EC tablet Take 81 mg by mouth daily. Swallow whole.  Marland Kitchen atorvastatin (LIPITOR) 20 MG tablet Take 1 tablet (20 mg total) by mouth daily.  . B Complex Vitamins (VITAMIN B COMPLEX PO) Take by mouth.  . cholecalciferol (VITAMIN D) 1000 units tablet Take by mouth daily.  . diclofenac sodium (VOLTAREN) 1 % GEL Apply 4 g topically 4 (four) times daily.  Marland Kitchen levothyroxine (SYNTHROID, LEVOTHROID) 25 MCG tablet Take 1 tablet (25 mcg total) by mouth daily before breakfast.  . losartan-hydrochlorothiazide (HYZAAR) 50-12.5 MG tablet Take 1 tablet by mouth daily.  Marland Kitchen omeprazole (PRILOSEC) 40 MG capsule Take 1 capsule (40 mg total) by mouth daily.  . phentermine (ADIPEX-P) 37.5 MG tablet Take 1 tablet (37.5 mg total) by mouth daily before breakfast.  . [DISCONTINUED] phentermine (ADIPEX-P) 37.5 MG tablet Take 1 tablet (37.5 mg total) by mouth daily before breakfast.   No facility-administered encounter medications on file as of 03/09/2019.     Surgical History: Past Surgical History:  Procedure Laterality Date  . BREAST CYST ASPIRATION Right    NEG  . BREAST EXCISIONAL BIOPSY Left 2002   NEG    Medical History: Past Medical History:  Diagnosis Date  . Hyperlipidemia   .  Hypertension     Family History: Family History  Problem Relation Age of Onset  . Multiple myeloma Mother   . Stroke Father   . Hypertension Father   . Breast cancer Neg Hx     Social History   Socioeconomic History  . Marital status: Divorced    Spouse name: Not on file  . Number of children: Not on file  . Years of education: Not on file  . Highest education level: Not on file  Occupational History  . Not on file  Social Needs  . Financial resource strain: Not on file  . Food insecurity    Worry: Not on file    Inability: Not on file  . Transportation needs    Medical: Not on file    Non-medical: Not on file  Tobacco Use  . Smoking status: Former Research scientist (life sciences)  . Smokeless tobacco: Never Used  Substance and Sexual Activity  . Alcohol use: Yes    Comment: social  . Drug use: No  . Sexual activity: Not on file  Lifestyle  . Physical activity    Days per week: Not on file    Minutes per session: Not on file  . Stress: Not on file  Relationships  . Social Herbalist on phone: Not on file    Gets together: Not on file    Attends religious service: Not on file    Active member of club or organization: Not on file  Attends meetings of clubs or organizations: Not on file    Relationship status: Not on file  . Intimate partner violence    Fear of current or ex partner: Not on file    Emotionally abused: Not on file    Physically abused: Not on file    Forced sexual activity: Not on file  Other Topics Concern  . Not on file  Social History Narrative  . Not on file      Review of Systems  Constitutional: Negative for chills, fatigue and unexpected weight change.  HENT: Negative for congestion, rhinorrhea, sneezing and sore throat.   Eyes: Negative for photophobia, pain and redness.  Respiratory: Negative for cough, chest tightness and shortness of breath.   Cardiovascular: Negative for chest pain and palpitations.  Gastrointestinal: Negative for  abdominal pain, constipation, diarrhea, nausea and vomiting.  Endocrine: Negative.   Genitourinary: Negative for dysuria and frequency.  Musculoskeletal: Negative for arthralgias, back pain, joint swelling and neck pain.  Skin: Negative for rash.  Allergic/Immunologic: Negative.   Neurological: Negative for tremors and numbness.  Hematological: Negative for adenopathy. Does not bruise/bleed easily.  Psychiatric/Behavioral: Negative for behavioral problems and sleep disturbance. The patient is not nervous/anxious.     Vital Signs: BP (!) 146/76 (BP Location: Right Arm, Patient Position: Sitting, Cuff Size: Large)   Pulse 90   Temp 99 F (37.2 C)   Resp 16   Ht 5' 1.5" (1.562 m)   Wt 203 lb (92.1 kg)   SpO2 98%   BMI 37.74 kg/m    Physical Exam Vitals signs and nursing note reviewed.  Constitutional:      General: She is not in acute distress.    Appearance: She is well-developed. She is not diaphoretic.  HENT:     Head: Normocephalic and atraumatic.     Mouth/Throat:     Pharynx: No oropharyngeal exudate.  Eyes:     Pupils: Pupils are equal, round, and reactive to light.  Neck:     Musculoskeletal: Normal range of motion and neck supple.     Thyroid: No thyromegaly.     Vascular: No JVD.     Trachea: No tracheal deviation.  Cardiovascular:     Rate and Rhythm: Normal rate and regular rhythm.     Heart sounds: Normal heart sounds. No murmur. No friction rub. No gallop.   Pulmonary:     Effort: Pulmonary effort is normal. No respiratory distress.     Breath sounds: Normal breath sounds. No wheezing or rales.  Chest:     Chest wall: No tenderness.  Abdominal:     Palpations: Abdomen is soft.     Tenderness: There is no abdominal tenderness. There is no guarding.  Musculoskeletal: Normal range of motion.  Lymphadenopathy:     Cervical: No cervical adenopathy.  Skin:    General: Skin is warm and dry.  Neurological:     Mental Status: She is alert and oriented to  person, place, and time.     Cranial Nerves: No cranial nerve deficit.  Psychiatric:        Behavior: Behavior normal.        Thought Content: Thought content normal.        Judgment: Judgment normal.    Assessment/Plan: 1. Essential hypertension Slightly elevated in office.  Pt was running late and stressing to get to office.    2. Class 3 severe obesity due to excess calories without serious comorbidity with body mass index (BMI) of  40.0 to 44.9 in adult [facility]) Obesity Counseling: Risk Assessment: An assessment of behavioral risk factors was made today and includes lack of exercise sedentary lifestyle, lack of portion control and poor dietary habits.  Risk Modification Advice: She was counseled on portion control guidelines. Restricting daily caloric intake to. . The detrimental long term effects of obesity on her health and ongoing poor compliance was also discussed with the patient.  There is a liability release in patients' chart. There has been a 10 minute discussion about the side effects including but not limited to elevated blood pressure, anxiety, lack of sleep and dry mouth. Pt understands and will like to start/continue on appetite suppressant at this time. There will be one month RX given at the time of visit with proper follow up. Nova diet plan with restricted calories is given to the pt. Pt understands and agrees with  plan of treatment  Discussed that patient should work toward losing 3-6 pounds in next month.  Continue dietary changes and work on increasing water intake and activity.  -Phentermine 37.82m capsules reordered for patient.   3. Gastroesophageal reflux disease without esophagitis Stable, continue present regimen  General Counseling: GOsceolaverbalizes understanding of the findings of todays visit and agrees with plan of treatment. I have discussed any further diagnostic evaluation that may be needed or ordered today. We also reviewed her medications today. she  has been encouraged to call the office with any questions or concerns that should arise related to todays visit.    No orders of the defined types were placed in this encounter.   Meds ordered this encounter  Medications  . phentermine (ADIPEX-P) 37.5 MG tablet    Sig: Take 1 tablet (37.5 mg total) by mouth daily before breakfast.    Dispense:  30 tablet    Refill:  0    Time spent: 20 Minutes   This patient was seen by AOrson GearAGNP-C in Collaboration with Dr FLavera Guiseas a part of collaborative care agreement     AKendell BaneAGNP-C Internal medicine

## 2019-04-06 ENCOUNTER — Ambulatory Visit (INDEPENDENT_AMBULATORY_CARE_PROVIDER_SITE_OTHER): Payer: PPO | Admitting: Adult Health

## 2019-04-06 ENCOUNTER — Encounter: Payer: Self-pay | Admitting: Adult Health

## 2019-04-06 ENCOUNTER — Other Ambulatory Visit: Payer: Self-pay

## 2019-04-06 VITALS — BP 135/72 | HR 87 | Resp 16 | Ht 61.5 in | Wt 204.0 lb

## 2019-04-06 DIAGNOSIS — K219 Gastro-esophageal reflux disease without esophagitis: Secondary | ICD-10-CM

## 2019-04-06 DIAGNOSIS — I10 Essential (primary) hypertension: Secondary | ICD-10-CM

## 2019-04-06 DIAGNOSIS — E668 Other obesity: Secondary | ICD-10-CM | POA: Diagnosis not present

## 2019-04-06 DIAGNOSIS — Z6841 Body Mass Index (BMI) 40.0 and over, adult: Secondary | ICD-10-CM

## 2019-04-06 DIAGNOSIS — E669 Obesity, unspecified: Secondary | ICD-10-CM

## 2019-04-06 DIAGNOSIS — E66813 Obesity, class 3: Secondary | ICD-10-CM

## 2019-04-06 MED ORDER — PHENTERMINE HCL 37.5 MG PO TABS: 37.5000 mg | ORAL_TABLET | Freq: Every day | ORAL | 0 refills | Status: DC

## 2019-04-06 NOTE — Progress Notes (Signed)
[facility] [place], [place] [address]  Internal MEDICINE  Office Visit Note  Patient Name: Savannah Franco  [id number]  [id number]  Date of Service: 04/06/2019  Chief Complaint  Patient presents with  . Medical Management of Chronic Issues    4 week follow up weight loss management     HPI PT is here for follow up on HTN and weight loss. Her blood pressure is much improved at this visit. She reports she was intermittently taking the phentermine.  She has gained 1 pound, and reports that her diet was out of control due to some personal situations in her life.  She is recommitted at this time, and would like to try again. She denies any side effects from the medication.     Current Medication: Outpatient Encounter Medications as of 04/06/2019  Medication Sig  . aspirin 81 MG EC tablet Take 81 mg by mouth daily. Swallow whole.  Marland Kitchen atorvastatin (LIPITOR) 20 MG tablet Take 1 tablet (20 mg total) by mouth daily.  . B Complex Vitamins (VITAMIN B COMPLEX PO) Take by mouth.  . cholecalciferol (VITAMIN D) 1000 units tablet Take by mouth daily.  . diclofenac sodium (VOLTAREN) 1 % GEL Apply 4 g topically 4 (four) times daily.  Marland Kitchen levothyroxine (SYNTHROID, LEVOTHROID) 25 MCG tablet Take 1 tablet (25 mcg total) by mouth daily before breakfast.  . losartan-hydrochlorothiazide (HYZAAR) 50-12.5 MG tablet Take 1 tablet by mouth daily.  Marland Kitchen omeprazole (PRILOSEC) 40 MG capsule Take 1 capsule (40 mg total) by mouth daily.  . phentermine (ADIPEX-P) 37.5 MG tablet Take 1 tablet (37.5 mg total) by mouth daily before breakfast.   No facility-administered encounter medications on file as of 04/06/2019.     Surgical History: Past Surgical History:  Procedure Laterality Date  . BREAST CYST ASPIRATION Right    NEG  . BREAST EXCISIONAL BIOPSY Left 2002   NEG    Medical History: Past Medical History:  Diagnosis Date  . Hyperlipidemia   . Hypertension     Family  History: Family History  Problem Relation Age of Onset  . Multiple myeloma Mother   . Stroke Father   . Hypertension Father   . Breast cancer Neg Hx     Social History   Socioeconomic History  . Marital status: Divorced    Spouse name: Not on file  . Number of children: Not on file  . Years of education: Not on file  . Highest education level: Not on file  Occupational History  . Not on file  Social Needs  . Financial resource strain: Not on file  . Food insecurity    Worry: Not on file    Inability: Not on file  . Transportation needs    Medical: Not on file    Non-medical: Not on file  Tobacco Use  . Smoking status: Former Research scientist (life sciences)  . Smokeless tobacco: Never Used  Substance and Sexual Activity  . Alcohol use: Yes    Comment: social  . Drug use: No  . Sexual activity: Not on file  Lifestyle  . Physical activity    Days per week: Not on file    Minutes per session: Not on file  . Stress: Not on file  Relationships  . Social Herbalist on phone: Not on file    Gets together: Not on file    Attends religious service: Not on file    Active member of club or organization: Not  on file    Attends meetings of clubs or organizations: Not on file    Relationship status: Not on file  . Intimate partner violence    Fear of current or ex partner: Not on file    Emotionally abused: Not on file    Physically abused: Not on file    Forced sexual activity: Not on file  Other Topics Concern  . Not on file  Social History Narrative  . Not on file      Review of Systems  Constitutional: Negative for chills, fatigue and unexpected weight change.  HENT: Negative for congestion, rhinorrhea, sneezing and sore throat.   Eyes: Negative for photophobia, pain and redness.  Respiratory: Negative for cough, chest tightness and shortness of breath.   Cardiovascular: Negative for chest pain and palpitations.  Gastrointestinal: Negative for abdominal pain, constipation,  diarrhea, nausea and vomiting.  Endocrine: Negative.   Genitourinary: Negative for dysuria and frequency.  Musculoskeletal: Negative for arthralgias, back pain, joint swelling and neck pain.  Skin: Negative for rash.  Allergic/Immunologic: Negative.   Neurological: Negative for tremors and numbness.  Hematological: Negative for adenopathy. Does not bruise/bleed easily.  Psychiatric/Behavioral: Negative for behavioral problems and sleep disturbance. The patient is not nervous/anxious.     Vital Signs: BP 135/72   Pulse 87   Resp 16   Ht 5' 1.5" (1.562 m)   Wt 204 lb (92.5 kg)   SpO2 97%   BMI 37.92 kg/m    Physical Exam Vitals signs and nursing note reviewed.  Constitutional:      General: She is not in acute distress.    Appearance: She is well-developed. She is not diaphoretic.  HENT:     Head: Normocephalic and atraumatic.     Mouth/Throat:     Pharynx: No oropharyngeal exudate.  Eyes:     Pupils: Pupils are equal, round, and reactive to light.  Neck:     Musculoskeletal: Normal range of motion and neck supple.     Thyroid: No thyromegaly.     Vascular: No JVD.     Trachea: No tracheal deviation.  Cardiovascular:     Rate and Rhythm: Normal rate and regular rhythm.     Heart sounds: Normal heart sounds. No murmur. No friction rub. No gallop.   Pulmonary:     Effort: Pulmonary effort is normal. No respiratory distress.     Breath sounds: Normal breath sounds. No wheezing or rales.  Chest:     Chest wall: No tenderness.  Abdominal:     Palpations: Abdomen is soft.     Tenderness: There is no abdominal tenderness. There is no guarding.  Musculoskeletal: Normal range of motion.  Lymphadenopathy:     Cervical: No cervical adenopathy.  Skin:    General: Skin is warm and dry.  Neurological:     Mental Status: She is alert and oriented to person, place, and time.     Cranial Nerves: No cranial nerve deficit.  Psychiatric:        Behavior: Behavior normal.         Thought Content: Thought content normal.        Judgment: Judgment normal.     Assessment/Plan: 1. Essential hypertension Stable, continue present mgmt.   2. Class 3 severe obesity due to excess calories without serious comorbidity with body mass index (BMI) of 40.0 to 44.9 in adult [facility]) There is a liability release in patients' chart. There has been a 10 minute discussion about the side  effects including but not limited to elevated blood pressure, anxiety, lack of sleep and dry mouth. Pt understands and will like to start/continue on appetite suppressant at this time. There will be one month RX given at the time of visit with proper follow up. Nova diet plan with restricted calories is given to the pt. Pt understands and agrees with  plan of treatment - phentermine (ADIPEX-P) 37.5 MG tablet; Take 1 tablet (37.5 mg total) by mouth daily before breakfast.  Dispense: 30 tablet; Refill: 0  3. Gastroesophageal reflux disease without esophagitis Stable, continue present mgmt.     General Counseling: nataki mccrumb understanding of the findings of todays visit and agrees with plan of treatment. I have discussed any further diagnostic evaluation that may be needed or ordered today. We also reviewed her medications today. she has been encouraged to call the office with any questions or concerns that should arise related to todays visit.    No orders of the defined types were placed in this encounter.   No orders of the defined types were placed in this encounter.   Time spent: 15 Minutes   This patient was seen by Orson Gear AGNP-C in Collaboration with Dr Lavera Guise as a part of collaborative care agreement     Kendell Bane AGNP-C Internal medicine

## 2019-04-18 ENCOUNTER — Other Ambulatory Visit: Payer: Self-pay | Admitting: Adult Health

## 2019-04-18 DIAGNOSIS — E039 Hypothyroidism, unspecified: Secondary | ICD-10-CM

## 2019-04-18 MED ORDER — LEVOTHYROXINE SODIUM 25 MCG PO TABS: 25.0000 ug | ORAL_TABLET | Freq: Every day | ORAL | 5 refills | Status: DC

## 2019-05-04 ENCOUNTER — Other Ambulatory Visit: Payer: Self-pay

## 2019-05-04 ENCOUNTER — Ambulatory Visit (INDEPENDENT_AMBULATORY_CARE_PROVIDER_SITE_OTHER): Payer: PPO | Admitting: Adult Health

## 2019-05-04 ENCOUNTER — Encounter: Payer: Self-pay | Admitting: Adult Health

## 2019-05-04 VITALS — BP 132/84 | HR 90 | Resp 16 | Ht 61.5 in | Wt 200.0 lb

## 2019-05-04 DIAGNOSIS — I10 Essential (primary) hypertension: Secondary | ICD-10-CM

## 2019-05-04 DIAGNOSIS — Z6841 Body Mass Index (BMI) 40.0 and over, adult: Secondary | ICD-10-CM

## 2019-05-04 DIAGNOSIS — K219 Gastro-esophageal reflux disease without esophagitis: Secondary | ICD-10-CM | POA: Diagnosis not present

## 2019-05-04 MED ORDER — PHENTERMINE HCL 37.5 MG PO TABS: 37.5000 mg | ORAL_TABLET | Freq: Every day | ORAL | 0 refills | Status: DC

## 2019-05-04 NOTE — Progress Notes (Signed)
[facility] [place], [place] [address]  Internal MEDICINE  Office Visit Note  Patient Name: Savannah Franco  [id number]  [id number]  Date of Service: 05/04/2019  Chief Complaint  Patient presents with  . Medical Management of Chronic Issues    4 week follow up weight loss management     HPI  Pt is here for 4 week follow up on weight loss. Patient is using phentermine for weight loss.  Since our last visit they have lost 4 pounds.  The patient denies any chest pain, palpitations, shortness of breath, constipation, headaches or any other side effects of the medication.  Patient wishes to continue to use this medication for weight loss at this time.   Current Medication: Outpatient Encounter Medications as of 05/04/2019  Medication Sig  . aspirin 81 MG EC tablet Take 81 mg by mouth daily. Swallow whole.  Marland Kitchen atorvastatin (LIPITOR) 20 MG tablet Take 1 tablet (20 mg total) by mouth daily.  . B Complex Vitamins (VITAMIN B COMPLEX PO) Take by mouth.  . cholecalciferol (VITAMIN D) 1000 units tablet Take by mouth daily.  . diclofenac sodium (VOLTAREN) 1 % GEL Apply 4 g topically 4 (four) times daily.  Marland Kitchen levothyroxine (SYNTHROID) 25 MCG tablet Take 1 tablet (25 mcg total) by mouth daily before breakfast.  . losartan-hydrochlorothiazide (HYZAAR) 50-12.5 MG tablet Take 1 tablet by mouth daily.  Marland Kitchen omeprazole (PRILOSEC) 40 MG capsule Take 1 capsule (40 mg total) by mouth daily.  . phentermine (ADIPEX-P) 37.5 MG tablet Take 1 tablet (37.5 mg total) by mouth daily before breakfast.  . [DISCONTINUED] phentermine (ADIPEX-P) 37.5 MG tablet Take 1 tablet (37.5 mg total) by mouth daily before breakfast.   No facility-administered encounter medications on file as of 05/04/2019.     Surgical History: Past Surgical History:  Procedure Laterality Date  . BREAST CYST ASPIRATION Right    NEG  . BREAST EXCISIONAL BIOPSY Left 2002   NEG    Medical History: Past Medical History:   Diagnosis Date  . Hyperlipidemia   . Hypertension     Family History: Family History  Problem Relation Age of Onset  . Multiple myeloma Mother   . Stroke Father   . Hypertension Father   . Breast cancer Neg Hx     Social History   Socioeconomic History  . Marital status: Divorced    Spouse name: Not on file  . Number of children: Not on file  . Years of education: Not on file  . Highest education level: Not on file  Occupational History  . Not on file  Social Needs  . Financial resource strain: Not on file  . Food insecurity    Worry: Not on file    Inability: Not on file  . Transportation needs    Medical: Not on file    Non-medical: Not on file  Tobacco Use  . Smoking status: Former Research scientist (life sciences)  . Smokeless tobacco: Never Used  Substance and Sexual Activity  . Alcohol use: Yes    Comment: social  . Drug use: No  . Sexual activity: Not on file  Lifestyle  . Physical activity    Days per week: Not on file    Minutes per session: Not on file  . Stress: Not on file  Relationships  . Social Herbalist on phone: Not on file    Gets together: Not on file    Attends religious service: Not on file  Active member of club or organization: Not on file    Attends meetings of clubs or organizations: Not on file    Relationship status: Not on file  . Intimate partner violence    Fear of current or ex partner: Not on file    Emotionally abused: Not on file    Physically abused: Not on file    Forced sexual activity: Not on file  Other Topics Concern  . Not on file  Social History Narrative  . Not on file      Review of Systems  Constitutional: Negative for chills, fatigue and unexpected weight change.  HENT: Negative for congestion, rhinorrhea, sneezing and sore throat.   Eyes: Negative for photophobia, pain and redness.  Respiratory: Negative for cough, chest tightness and shortness of breath.   Cardiovascular: Negative for chest pain and  palpitations.  Gastrointestinal: Negative for abdominal pain, constipation, diarrhea, nausea and vomiting.  Endocrine: Negative.   Genitourinary: Negative for dysuria and frequency.  Musculoskeletal: Negative for arthralgias, back pain, joint swelling and neck pain.  Skin: Negative for rash.  Allergic/Immunologic: Negative.   Neurological: Negative for tremors and numbness.  Hematological: Negative for adenopathy. Does not bruise/bleed easily.  Psychiatric/Behavioral: Negative for behavioral problems and sleep disturbance. The patient is not nervous/anxious.     Vital Signs: BP 132/84   Pulse 90   Resp 16   Ht 5' 1.5" (1.562 m)   Wt 200 lb (90.7 kg)   SpO2 98%   BMI 37.18 kg/m    Physical Exam Vitals signs and nursing note reviewed.  Constitutional:      General: She is not in acute distress.    Appearance: She is well-developed. She is not diaphoretic.  HENT:     Head: Normocephalic and atraumatic.     Mouth/Throat:     Pharynx: No oropharyngeal exudate.  Eyes:     Pupils: Pupils are equal, round, and reactive to light.  Neck:     Musculoskeletal: Normal range of motion and neck supple.     Thyroid: No thyromegaly.     Vascular: No JVD.     Trachea: No tracheal deviation.  Cardiovascular:     Rate and Rhythm: Normal rate and regular rhythm.     Heart sounds: Normal heart sounds. No murmur. No friction rub. No gallop.   Pulmonary:     Effort: Pulmonary effort is normal. No respiratory distress.     Breath sounds: Normal breath sounds. No wheezing or rales.  Chest:     Chest wall: No tenderness.  Abdominal:     Palpations: Abdomen is soft.     Tenderness: There is no abdominal tenderness. There is no guarding.  Musculoskeletal: Normal range of motion.  Lymphadenopathy:     Cervical: No cervical adenopathy.  Skin:    General: Skin is warm and dry.  Neurological:     Mental Status: She is alert and oriented to person, place, and time.     Cranial Nerves: No  cranial nerve deficit.  Psychiatric:        Behavior: Behavior normal.        Thought Content: Thought content normal.        Judgment: Judgment normal.    Assessment/Plan: 1. Class 3 severe obesity due to excess calories without serious comorbidity with body mass index (BMI) of 40.0 to 44.9 in adult [facility]) Obesity Counseling: Risk Assessment: An assessment of behavioral risk factors was made today and includes lack of exercise sedentary lifestyle, lack  of portion control and poor dietary habits.  Risk Modification Advice: She was counseled on portion control guidelines. Restricting daily caloric intake to. . The detrimental long term effects of obesity on her health and ongoing poor compliance was also discussed with the patient.  There is a liability release in patients' chart. There has been a 10 minute discussion about the side effects including but not limited to elevated blood pressure, anxiety, lack of sleep and dry mouth. Pt understands and will like to start/continue on appetite suppressant at this time. There will be one month RX given at the time of visit with proper follow up. Nova diet plan with restricted calories is given to the pt. Pt understands and agrees with  plan of treatment  - phentermine (ADIPEX-P) 37.5 MG tablet; Take 1 tablet (37.5 mg total) by mouth daily before breakfast.  Dispense: 30 tablet; Refill: 0  2. Essential hypertension Continue present management.  3. Gastroesophageal reflux disease without esophagitis Stable, continue current therapy.   General Counseling: afreen siebels understanding of the findings of todays visit and agrees with plan of treatment. I have discussed any further diagnostic evaluation that may be needed or ordered today. We also reviewed her medications today. she has been encouraged to call the office with any questions or concerns that should arise related to todays visit.    No orders of the defined types were placed in this  encounter.   Meds ordered this encounter  Medications  . phentermine (ADIPEX-P) 37.5 MG tablet    Sig: Take 1 tablet (37.5 mg total) by mouth daily before breakfast.    Dispense:  30 tablet    Refill:  0    Time spent: 15 Minutes   This patient was seen by Orson Gear AGNP-C in Collaboration with Dr Lavera Guise as a part of collaborative care agreement     Kendell Bane AGNP-C Internal medicine

## 2019-05-23 ENCOUNTER — Other Ambulatory Visit: Payer: Self-pay | Admitting: Adult Health

## 2019-05-23 DIAGNOSIS — E7800 Pure hypercholesterolemia, unspecified: Secondary | ICD-10-CM

## 2019-06-05 ENCOUNTER — Other Ambulatory Visit: Payer: Self-pay

## 2019-06-05 ENCOUNTER — Ambulatory Visit (INDEPENDENT_AMBULATORY_CARE_PROVIDER_SITE_OTHER): Payer: PPO | Admitting: Adult Health

## 2019-06-05 ENCOUNTER — Encounter: Payer: Self-pay | Admitting: Adult Health

## 2019-06-05 VITALS — BP 130/80 | HR 87 | Temp 97.5°F | Resp 16 | Ht 61.5 in | Wt 199.2 lb

## 2019-06-05 DIAGNOSIS — Z6841 Body Mass Index (BMI) 40.0 and over, adult: Secondary | ICD-10-CM | POA: Diagnosis not present

## 2019-06-05 DIAGNOSIS — E782 Mixed hyperlipidemia: Secondary | ICD-10-CM

## 2019-06-05 DIAGNOSIS — I10 Essential (primary) hypertension: Secondary | ICD-10-CM

## 2019-06-05 MED ORDER — PHENTERMINE HCL 37.5 MG PO TABS: 37.5000 mg | ORAL_TABLET | Freq: Every day | ORAL | 0 refills | Status: DC

## 2019-06-05 NOTE — Progress Notes (Signed)
[facility] [place], [place] [address]  Internal MEDICINE  Office Visit Note  Patient Name: Savannah Franco  [id number]  [id number]  Date of Service: 06/05/2019  Chief Complaint  Patient presents with  . Hypertension  . Hyperlipidemia  . Medical Management of Chronic Issues    Weight Management    HPI  Pt is here for follow up on HTN, HLD, as well as weight loss.  Patient is using phentermine for weight loss.  Since our last visit they have lost  1 pounds.  The patient denies any chest pain, palpitations, shortness of breath, constipation, headaches or any other side effects of the medication.  Patient wishes to continue to use this medication for weight loss at this time.   Current Medication: Outpatient Encounter Medications as of 06/05/2019  Medication Sig  . aspirin 81 MG EC tablet Take 81 mg by mouth daily. Swallow whole.  Marland Kitchen atorvastatin (LIPITOR) 20 MG tablet TAKE 1 TABLET(20 MG) BY MOUTH DAILY  . B Complex Vitamins (VITAMIN B COMPLEX PO) Take by mouth.  . cholecalciferol (VITAMIN D) 1000 units tablet Take by mouth daily.  . diclofenac sodium (VOLTAREN) 1 % GEL Apply 4 g topically 4 (four) times daily.  Marland Kitchen levothyroxine (SYNTHROID) 25 MCG tablet Take 1 tablet (25 mcg total) by mouth daily before breakfast.  . losartan-hydrochlorothiazide (HYZAAR) 50-12.5 MG tablet Take 1 tablet by mouth daily.  Marland Kitchen omeprazole (PRILOSEC) 40 MG capsule Take 1 capsule (40 mg total) by mouth daily.  . phentermine (ADIPEX-P) 37.5 MG tablet Take 1 tablet (37.5 mg total) by mouth daily before breakfast.  . [DISCONTINUED] phentermine (ADIPEX-P) 37.5 MG tablet Take 1 tablet (37.5 mg total) by mouth daily before breakfast.   No facility-administered encounter medications on file as of 06/05/2019.     Surgical History: Past Surgical History:  Procedure Laterality Date  . BREAST CYST ASPIRATION Right    NEG  . BREAST EXCISIONAL BIOPSY Left 2002   NEG    Medical  History: Past Medical History:  Diagnosis Date  . Hyperlipidemia   . Hypertension     Family History: Family History  Problem Relation Age of Onset  . Multiple myeloma Mother   . Stroke Father   . Hypertension Father   . Breast cancer Neg Hx     Social History   Socioeconomic History  . Marital status: Divorced    Spouse name: Not on file  . Number of children: Not on file  . Years of education: Not on file  . Highest education level: Not on file  Occupational History  . Not on file  Social Needs  . Financial resource strain: Not on file  . Food insecurity    Worry: Not on file    Inability: Not on file  . Transportation needs    Medical: Not on file    Non-medical: Not on file  Tobacco Use  . Smoking status: Former Research scientist (life sciences)  . Smokeless tobacco: Never Used  Substance and Sexual Activity  . Alcohol use: Yes    Comment: social  . Drug use: No  . Sexual activity: Not on file  Lifestyle  . Physical activity    Days per week: Not on file    Minutes per session: Not on file  . Stress: Not on file  Relationships  . Social Herbalist on phone: Not on file    Gets together: Not on file    Attends religious service: Not  on file    Active member of club or organization: Not on file    Attends meetings of clubs or organizations: Not on file    Relationship status: Not on file  . Intimate partner violence    Fear of current or ex partner: Not on file    Emotionally abused: Not on file    Physically abused: Not on file    Forced sexual activity: Not on file  Other Topics Concern  . Not on file  Social History Narrative  . Not on file      Review of Systems  Constitutional: Negative for chills, fatigue and unexpected weight change.  HENT: Negative for congestion, rhinorrhea, sneezing and sore throat.   Eyes: Negative for photophobia, pain and redness.  Respiratory: Negative for cough, chest tightness and shortness of breath.   Cardiovascular:  Negative for chest pain and palpitations.  Gastrointestinal: Negative for abdominal pain, constipation, diarrhea, nausea and vomiting.  Endocrine: Negative.   Genitourinary: Negative for dysuria and frequency.  Musculoskeletal: Negative for arthralgias, back pain, joint swelling and neck pain.  Skin: Negative for rash.  Allergic/Immunologic: Negative.   Neurological: Negative for tremors and numbness.  Hematological: Negative for adenopathy. Does not bruise/bleed easily.  Psychiatric/Behavioral: Negative for behavioral problems and sleep disturbance. The patient is not nervous/anxious.     Vital Signs: BP 130/80   Pulse 87   Temp (!) 97.5 F (36.4 C)   Resp 16   Ht 5' 1.5" (1.562 m)   Wt 199 lb 3.2 oz (90.4 kg)   SpO2 98%   BMI 37.03 kg/m    Physical Exam Vitals signs and nursing note reviewed.  Constitutional:      General: She is not in acute distress.    Appearance: She is well-developed. She is not diaphoretic.  HENT:     Head: Normocephalic and atraumatic.     Mouth/Throat:     Pharynx: No oropharyngeal exudate.  Eyes:     Pupils: Pupils are equal, round, and reactive to light.  Neck:     Musculoskeletal: Normal range of motion and neck supple.     Thyroid: No thyromegaly.     Vascular: No JVD.     Trachea: No tracheal deviation.  Cardiovascular:     Rate and Rhythm: Normal rate and regular rhythm.     Heart sounds: Normal heart sounds. No murmur. No friction rub. No gallop.   Pulmonary:     Effort: Pulmonary effort is normal. No respiratory distress.     Breath sounds: Normal breath sounds. No wheezing or rales.  Chest:     Chest wall: No tenderness.  Abdominal:     Palpations: Abdomen is soft.     Tenderness: There is no abdominal tenderness. There is no guarding.  Musculoskeletal: Normal range of motion.  Lymphadenopathy:     Cervical: No cervical adenopathy.  Skin:    General: Skin is warm and dry.  Neurological:     Mental Status: She is alert and  oriented to person, place, and time.     Cranial Nerves: No cranial nerve deficit.  Psychiatric:        Behavior: Behavior normal.        Thought Content: Thought content normal.        Judgment: Judgment normal.     Assessment/Plan: 1. Essential hypertension Stable, continue present management.   2. Mixed hyperlipidemia Stable, continue to take Lipitor as directed.   3. Class 3 severe obesity due to excess  calories without serious comorbidity with body mass index (BMI) of 40.0 to 44.9 in adult [facility]) Discussed, 3 pound goal for weight loss over the next month.  Obesity Counseling: Risk Assessment: An assessment of behavioral risk factors was made today and includes lack of exercise sedentary lifestyle, lack of portion control and poor dietary habits.  Risk Modification Advice: She was counseled on portion control guidelines. Restricting daily caloric intake to. . The detrimental long term effects of obesity on her health and ongoing poor compliance was also discussed with the patient.  There is a liability release in patients' chart. There has been a 10 minute discussion about the side effects including but not limited to elevated blood pressure, anxiety, lack of sleep and dry mouth. Pt understands and will like to start/continue on appetite suppressant at this time. There will be one month RX given at the time of visit with proper follow up. Nova diet plan with restricted calories is given to the pt. Pt understands and agrees with  plan of treatment - phentermine (ADIPEX-P) 37.5 MG tablet; Take 1 tablet (37.5 mg total) by mouth daily before breakfast.  Dispense: 30 tablet; Refill: 0  General Counseling: Angle verbalizes understanding of the findings of todays visit and agrees with plan of treatment. I have discussed any further diagnostic evaluation that may be needed or ordered today. We also reviewed her medications today. she has been encouraged to call the office with any questions  or concerns that should arise related to todays visit.    No orders of the defined types were placed in this encounter.   Meds ordered this encounter  Medications  . phentermine (ADIPEX-P) 37.5 MG tablet    Sig: Take 1 tablet (37.5 mg total) by mouth daily before breakfast.    Dispense:  30 tablet    Refill:  0    Time spent: 15 Minutes   This patient was seen by Orson Gear AGNP-C in Collaboration with Dr Lavera Guise as a part of collaborative care agreement     Kendell Bane AGNP-C Internal medicine

## 2019-07-05 ENCOUNTER — Encounter: Payer: Self-pay | Admitting: Adult Health

## 2019-07-05 ENCOUNTER — Ambulatory Visit (INDEPENDENT_AMBULATORY_CARE_PROVIDER_SITE_OTHER): Payer: PPO | Admitting: Adult Health

## 2019-07-05 ENCOUNTER — Other Ambulatory Visit: Payer: Self-pay

## 2019-07-05 VITALS — BP 133/80 | HR 79 | Temp 97.0°F | Resp 16 | Ht 62.0 in | Wt 195.0 lb

## 2019-07-05 DIAGNOSIS — Z6841 Body Mass Index (BMI) 40.0 and over, adult: Secondary | ICD-10-CM

## 2019-07-05 DIAGNOSIS — K219 Gastro-esophageal reflux disease without esophagitis: Secondary | ICD-10-CM

## 2019-07-05 DIAGNOSIS — I10 Essential (primary) hypertension: Secondary | ICD-10-CM | POA: Diagnosis not present

## 2019-07-05 MED ORDER — PHENTERMINE HCL 37.5 MG PO TABS: 37.5000 mg | ORAL_TABLET | Freq: Every day | ORAL | 0 refills | Status: DC

## 2019-07-05 NOTE — Progress Notes (Signed)
[facility] [place], [place] [address]  Internal MEDICINE  Office Visit Note  Patient Name: Savannah Franco  [id number]  [id number]  Date of Service: 07/05/2019  Chief Complaint  Patient presents with  . Hypertension  . Medical Management of Chronic Issues    Weight loss    HPI Patient is using phentermine for weight loss.  Since our last visit they have lost 5 pounds.  The patient denies any chest pain, palpitations, shortness of breath, constipation, headaches or any other side effects of the medication.  Patient wishes to continue to use this medication for weight loss at this time.    Current Medication: Outpatient Encounter Medications as of 07/05/2019  Medication Sig  . aspirin 81 MG EC tablet Take 81 mg by mouth daily. Swallow whole.  Marland Kitchen atorvastatin (LIPITOR) 20 MG tablet TAKE 1 TABLET(20 MG) BY MOUTH DAILY  . B Complex Vitamins (VITAMIN B COMPLEX PO) Take by mouth.  . cholecalciferol (VITAMIN D) 1000 units tablet Take by mouth daily.  . diclofenac sodium (VOLTAREN) 1 % GEL Apply 4 g topically 4 (four) times daily.  Marland Kitchen levothyroxine (SYNTHROID) 25 MCG tablet Take 1 tablet (25 mcg total) by mouth daily before breakfast.  . losartan-hydrochlorothiazide (HYZAAR) 50-12.5 MG tablet Take 1 tablet by mouth daily.  Marland Kitchen omeprazole (PRILOSEC) 40 MG capsule Take 1 capsule (40 mg total) by mouth daily.  . phentermine (ADIPEX-P) 37.5 MG tablet Take 1 tablet (37.5 mg total) by mouth daily before breakfast.  . [DISCONTINUED] phentermine (ADIPEX-P) 37.5 MG tablet Take 1 tablet (37.5 mg total) by mouth daily before breakfast.   No facility-administered encounter medications on file as of 07/05/2019.     Surgical History: Past Surgical History:  Procedure Laterality Date  . BREAST CYST ASPIRATION Right    NEG  . BREAST EXCISIONAL BIOPSY Left 2002   NEG    Medical History: Past Medical History:  Diagnosis Date  . Hyperlipidemia   . Hypertension      Family History: Family History  Problem Relation Age of Onset  . Multiple myeloma Mother   . Stroke Father   . Hypertension Father   . Breast cancer Neg Hx     Social History   Socioeconomic History  . Marital status: Divorced    Spouse name: Not on file  . Number of children: Not on file  . Years of education: Not on file  . Highest education level: Not on file  Occupational History  . Not on file  Social Needs  . Financial resource strain: Not on file  . Food insecurity    Worry: Not on file    Inability: Not on file  . Transportation needs    Medical: Not on file    Non-medical: Not on file  Tobacco Use  . Smoking status: Former Research scientist (life sciences)  . Smokeless tobacco: Never Used  Substance and Sexual Activity  . Alcohol use: Yes    Comment: social  . Drug use: No  . Sexual activity: Not on file  Lifestyle  . Physical activity    Days per week: Not on file    Minutes per session: Not on file  . Stress: Not on file  Relationships  . Social Herbalist on phone: Not on file    Gets together: Not on file    Attends religious service: Not on file    Active member of club or organization: Not on file    Attends meetings  of clubs or organizations: Not on file    Relationship status: Not on file  . Intimate partner violence    Fear of current or ex partner: Not on file    Emotionally abused: Not on file    Physically abused: Not on file    Forced sexual activity: Not on file  Other Topics Concern  . Not on file  Social History Narrative  . Not on file      Review of Systems  Constitutional: Negative for chills, fatigue and unexpected weight change.  HENT: Negative for congestion, rhinorrhea, sneezing and sore throat.   Eyes: Negative for photophobia, pain and redness.  Respiratory: Negative for cough, chest tightness and shortness of breath.   Cardiovascular: Negative for chest pain and palpitations.  Gastrointestinal: Negative for abdominal pain,  constipation, diarrhea, nausea and vomiting.  Endocrine: Negative.   Genitourinary: Negative for dysuria and frequency.  Musculoskeletal: Negative for arthralgias, back pain, joint swelling and neck pain.  Skin: Negative for rash.  Allergic/Immunologic: Negative.   Neurological: Negative for tremors and numbness.  Hematological: Negative for adenopathy. Does not bruise/bleed easily.  Psychiatric/Behavioral: Negative for behavioral problems and sleep disturbance. The patient is not nervous/anxious.       Vital Signs: BP 133/80   Pulse 79   Temp (!) 97 F (36.1 C)   Resp 16   Ht 5' 2"  (1.575 m)   Wt 195 lb (88.5 kg)   SpO2 97%   BMI 35.67 kg/m    Physical Exam Vitals signs and nursing note reviewed.  Constitutional:      General: Savannah Franco is not in acute distress.    Appearance: Savannah Franco is well-developed. Savannah Franco is not diaphoretic.  HENT:     Head: Normocephalic and atraumatic.     Mouth/Throat:     Pharynx: No oropharyngeal exudate.  Eyes:     Pupils: Pupils are equal, round, and reactive to light.  Neck:     Musculoskeletal: Normal range of motion and neck supple.     Thyroid: No thyromegaly.     Vascular: No JVD.     Trachea: No tracheal deviation.  Cardiovascular:     Rate and Rhythm: Normal rate and regular rhythm.     Heart sounds: Normal heart sounds. No murmur. No friction rub. No gallop.   Pulmonary:     Effort: Pulmonary effort is normal. No respiratory distress.     Breath sounds: Normal breath sounds. No wheezing or rales.  Chest:     Chest wall: No tenderness.  Abdominal:     Palpations: Abdomen is soft.     Tenderness: There is no abdominal tenderness. There is no guarding.  Musculoskeletal: Normal range of motion.  Lymphadenopathy:     Cervical: No cervical adenopathy.  Skin:    General: Skin is warm and dry.  Neurological:     Mental Status: Savannah Franco is alert and oriented to person, place, and time.     Cranial Nerves: No cranial nerve deficit.  Psychiatric:         Behavior: Behavior normal.        Thought Content: Thought content normal.        Judgment: Judgment normal.    Assessment/Plan: 1. Class 3 severe obesity due to excess calories without serious comorbidity with body mass index (BMI) of 40.0 to 44.9 in adult [facility]) Obesity Counseling: Risk Assessment: An assessment of behavioral risk factors was made today and includes lack of exercise sedentary lifestyle, lack of portion control and  poor dietary habits.  Risk Modification Advice: Savannah Franco was counseled on portion control guidelines. Restricting daily caloric intake to. . The detrimental long term effects of obesity on her health and ongoing poor compliance was also discussed with the patient. - phentermine (ADIPEX-P) 37.5 MG tablet; Take 1 tablet (37.5 mg total) by mouth daily before breakfast.  Dispense: 30 tablet; Refill: 0  There is a liability release in patients' chart. There has been a 10 minute discussion about the side effects including but not limited to elevated blood pressure, anxiety, lack of sleep and dry mouth. Pt understands and will like to start/continue on appetite suppressant at this time. There will be one month RX given at the time of visit with proper follow up. Nova diet plan with restricted calories is given to the pt. Pt understands and agrees with  plan of treatment  2. Essential hypertension Controlled, continue present management.   3. Gastroesophageal reflux disease without esophagitis No recent issues, continue current therapy.   General Counseling: amalie koran understanding of the findings of todays visit and agrees with plan of treatment. I have discussed any further diagnostic evaluation that may be needed or ordered today. We also reviewed her medications today. Savannah Franco has been encouraged to call the office with any questions or concerns that should arise related to todays visit.    No orders of the defined types were placed in this encounter.   Meds  ordered this encounter  Medications  . phentermine (ADIPEX-P) 37.5 MG tablet    Sig: Take 1 tablet (37.5 mg total) by mouth daily before breakfast.    Dispense:  30 tablet    Refill:  0    Time spent:25 Minutes   This patient was seen by Orson Gear AGNP-C in Collaboration with Dr Lavera Guise as a part of collaborative care agreement     Kendell Bane AGNP-C Internal medicine

## 2019-08-08 ENCOUNTER — Telehealth: Payer: Self-pay

## 2019-08-08 DIAGNOSIS — H25041 Posterior subcapsular polar age-related cataract, right eye: Secondary | ICD-10-CM | POA: Diagnosis not present

## 2019-08-08 NOTE — Telephone Encounter (Signed)
Confirmed appointment with patient. klh °

## 2019-08-10 ENCOUNTER — Ambulatory Visit (INDEPENDENT_AMBULATORY_CARE_PROVIDER_SITE_OTHER): Payer: PPO | Admitting: Adult Health

## 2019-08-10 ENCOUNTER — Encounter: Payer: Self-pay | Admitting: Adult Health

## 2019-08-10 ENCOUNTER — Other Ambulatory Visit: Payer: Self-pay

## 2019-08-10 VITALS — BP 141/70 | HR 85 | Temp 96.9°F | Resp 16 | Ht 62.0 in | Wt 194.0 lb

## 2019-08-10 DIAGNOSIS — I10 Essential (primary) hypertension: Secondary | ICD-10-CM

## 2019-08-10 DIAGNOSIS — E782 Mixed hyperlipidemia: Secondary | ICD-10-CM | POA: Diagnosis not present

## 2019-08-10 DIAGNOSIS — K219 Gastro-esophageal reflux disease without esophagitis: Secondary | ICD-10-CM

## 2019-08-10 DIAGNOSIS — Z6841 Body Mass Index (BMI) 40.0 and over, adult: Secondary | ICD-10-CM | POA: Diagnosis not present

## 2019-08-10 DIAGNOSIS — E039 Hypothyroidism, unspecified: Secondary | ICD-10-CM | POA: Diagnosis not present

## 2019-08-10 NOTE — Progress Notes (Signed)
[facility] [place], [place] [address]  Internal MEDICINE  Office Visit Note  Patient Name: Savannah Franco  [id number]  [id number]  Date of Service: 08/10/2019  Chief Complaint  Patient presents with  . Medical Management of Chronic Issues    4 WEEK FOLLOW UP WEIGHT LOSS     HPI  Pt is here for 4 week follow up on weight loss. She has lost 1 pound.  This is her first visit with out new scale.  She reports she has lost 2-3 pounds by her home scale.  She says her clothes fit differently, and overall she is very happy.  She has been taking phentermine for 10 months.  Will take a break at this time.    Current Medication: Outpatient Encounter Medications as of 08/10/2019  Medication Sig  . aspirin 81 MG EC tablet Take 81 mg by mouth daily. Swallow whole.  Marland Kitchen atorvastatin (LIPITOR) 20 MG tablet TAKE 1 TABLET(20 MG) BY MOUTH DAILY  . B Complex Vitamins (VITAMIN B COMPLEX PO) Take by mouth.  . cholecalciferol (VITAMIN D) 1000 units tablet Take by mouth daily.  . diclofenac sodium (VOLTAREN) 1 % GEL Apply 4 g topically 4 (four) times daily.  Marland Kitchen levothyroxine (SYNTHROID) 25 MCG tablet Take 1 tablet (25 mcg total) by mouth daily before breakfast.  . losartan-hydrochlorothiazide (HYZAAR) 50-12.5 MG tablet Take 1 tablet by mouth daily.  Marland Kitchen omeprazole (PRILOSEC) 40 MG capsule Take 1 capsule (40 mg total) by mouth daily.  . phentermine (ADIPEX-P) 37.5 MG tablet Take 1 tablet (37.5 mg total) by mouth daily before breakfast.   No facility-administered encounter medications on file as of 08/10/2019.    Surgical History: Past Surgical History:  Procedure Laterality Date  . BREAST CYST ASPIRATION Right    NEG  . BREAST EXCISIONAL BIOPSY Left 2002   NEG    Medical History: Past Medical History:  Diagnosis Date  . Hyperlipidemia   . Hypertension     Family History: Family History  Problem Relation Age of Onset  . Multiple myeloma Mother   . Stroke Father    . Hypertension Father   . Breast cancer Neg Hx     Social History   Socioeconomic History  . Marital status: Divorced    Spouse name: Not on file  . Number of children: Not on file  . Years of education: Not on file  . Highest education level: Not on file  Occupational History  . Not on file  Tobacco Use  . Smoking status: Former Research scientist (life sciences)  . Smokeless tobacco: Never Used  Substance and Sexual Activity  . Alcohol use: Yes    Comment: social  . Drug use: No  . Sexual activity: Not on file  Other Topics Concern  . Not on file  Social History Narrative  . Not on file   Social Determinants of Health   Financial Resource Strain:   . Difficulty of Paying Living Expenses: Not on file  Food Insecurity:   . Worried About Charity fundraiser in the Last Year: Not on file  . Ran Out of Food in the Last Year: Not on file  Transportation Needs:   . Lack of Transportation (Medical): Not on file  . Lack of Transportation (Non-Medical): Not on file  Physical Activity:   . Days of Exercise per Week: Not on file  . Minutes of Exercise per Session: Not on file  Stress:   . Feeling of Stress : Not  on file  Social Connections:   . Frequency of Communication with Friends and Family: Not on file  . Frequency of Social Gatherings with Friends and Family: Not on file  . Attends Religious Services: Not on file  . Active Member of Clubs or Organizations: Not on file  . Attends Archivist Meetings: Not on file  . Marital Status: Not on file  Intimate Partner Violence:   . Fear of Current or Ex-Partner: Not on file  . Emotionally Abused: Not on file  . Physically Abused: Not on file  . Sexually Abused: Not on file      Review of Systems  Constitutional: Negative for chills, fatigue and unexpected weight change.  HENT: Negative for congestion, rhinorrhea, sneezing and sore throat.   Eyes: Negative for photophobia, pain and redness.  Respiratory: Negative for cough, chest  tightness and shortness of breath.   Cardiovascular: Negative for chest pain and palpitations.  Gastrointestinal: Negative for abdominal pain, constipation, diarrhea, nausea and vomiting.  Endocrine: Negative.   Genitourinary: Negative for dysuria and frequency.  Musculoskeletal: Negative for arthralgias, back pain, joint swelling and neck pain.  Skin: Negative for rash.  Allergic/Immunologic: Negative.   Neurological: Negative for tremors and numbness.  Hematological: Negative for adenopathy. Does not bruise/bleed easily.  Psychiatric/Behavioral: Negative for behavioral problems and sleep disturbance. The patient is not nervous/anxious.     Vital Signs: BP (!) 141/70   Pulse 85   Temp (!) 96.9 F (36.1 C)   Resp 16   Ht 5' 2"  (1.575 m)   Wt 194 lb (88 kg)   SpO2 98%   BMI 35.48 kg/m    Physical Exam Vitals and nursing note reviewed.  Constitutional:      General: She is not in acute distress.    Appearance: She is well-developed. She is not diaphoretic.  HENT:     Head: Normocephalic and atraumatic.     Mouth/Throat:     Pharynx: No oropharyngeal exudate.  Eyes:     Pupils: Pupils are equal, round, and reactive to light.  Neck:     Thyroid: No thyromegaly.     Vascular: No JVD.     Trachea: No tracheal deviation.  Cardiovascular:     Rate and Rhythm: Normal rate and regular rhythm.     Heart sounds: Normal heart sounds. No murmur. No friction rub. No gallop.   Pulmonary:     Effort: Pulmonary effort is normal. No respiratory distress.     Breath sounds: Normal breath sounds. No wheezing or rales.  Chest:     Chest wall: No tenderness.  Abdominal:     Palpations: Abdomen is soft.     Tenderness: There is no abdominal tenderness. There is no guarding.  Musculoskeletal:        General: Normal range of motion.     Cervical back: Normal range of motion and neck supple.  Lymphadenopathy:     Cervical: No cervical adenopathy.  Skin:    General: Skin is warm and  dry.  Neurological:     Mental Status: She is alert and oriented to person, place, and time.     Cranial Nerves: No cranial nerve deficit.  Psychiatric:        Behavior: Behavior normal.        Thought Content: Thought content normal.        Judgment: Judgment normal.     Assessment/Plan:  1. Essential hypertension Stable, continue present management  2. Class 3 severe  obesity due to excess calories without serious comorbidity with body mass index (BMI) of 40.0 to 44.9 in adult [facility]) Will stop phentermine for the next few months.  Discussed ways to increase aerobic activity, and encouraged patient to add exercise to routine.  Pt will be back in march 2021 for physical and will reevaluate then.  Obesity Counseling: Risk Assessment: An assessment of behavioral risk factors was made today and includes lack of exercise sedentary lifestyle, lack of portion control and poor dietary habits.  Risk Modification Advice: She was counseled on portion control guidelines. Restricting daily caloric intake to. . The detrimental long term effects of obesity on her health and ongoing poor compliance was also discussed with the patient.  3. Gastroesophageal reflux disease without esophagitis Stable, continue present therapy.  4. Acquired hypothyroidism Stable, will draw TSH and FT4 prior to physical in march.  Lab slip provided today.   5. Mixed hyperlipidemia Lipid panel will be rechecked before physical in march, lab slip provided.   General Counseling: ayrianna mcginniss understanding of the findings of todays visit and agrees with plan of treatment. I have discussed any further diagnostic evaluation that may be needed or ordered today. We also reviewed her medications today. she has been encouraged to call the office with any questions or concerns that should arise related to todays visit.    No orders of the defined types were placed in this encounter.   No orders of the defined types were  placed in this encounter.   Time spent: 25 Minutes   This patient was seen by Orson Gear AGNP-C in Collaboration with Dr Lavera Guise as a part of collaborative care agreement     Kendell Bane AGNP-C Internal medicine

## 2019-09-25 DIAGNOSIS — I10 Essential (primary) hypertension: Secondary | ICD-10-CM | POA: Diagnosis not present

## 2019-09-25 DIAGNOSIS — H2511 Age-related nuclear cataract, right eye: Secondary | ICD-10-CM | POA: Diagnosis not present

## 2019-10-09 ENCOUNTER — Other Ambulatory Visit: Payer: Self-pay

## 2019-10-09 ENCOUNTER — Encounter: Payer: Self-pay | Admitting: Ophthalmology

## 2019-10-10 ENCOUNTER — Other Ambulatory Visit
Admission: RE | Admit: 2019-10-10 | Discharge: 2019-10-10 | Disposition: A | Discharge status: Home / Self Care | Payer: PPO | Source: Ambulatory Visit | Attending: Ophthalmology | Admitting: Ophthalmology

## 2019-10-10 DIAGNOSIS — Z01812 Encounter for preprocedural laboratory examination: Secondary | ICD-10-CM | POA: Insufficient documentation

## 2019-10-10 DIAGNOSIS — Z20822 Contact with and (suspected) exposure to covid-19: Secondary | ICD-10-CM | POA: Insufficient documentation

## 2019-10-10 LAB — SARS CORONAVIRUS 2 (TAT 6-24 HRS): SARS Coronavirus 2: NEGATIVE

## 2019-10-10 NOTE — Discharge Instructions (Signed)

## 2019-10-12 ENCOUNTER — Ambulatory Visit: Payer: PPO | Admitting: Anesthesiology

## 2019-10-12 ENCOUNTER — Other Ambulatory Visit: Payer: Self-pay

## 2019-10-12 ENCOUNTER — Encounter: Payer: Self-pay | Admitting: Ophthalmology

## 2019-10-12 ENCOUNTER — Ambulatory Visit
Admission: RE | Admit: 2019-10-12 | Discharge: 2019-10-12 | Disposition: A | Discharge status: Home / Self Care | Payer: PPO | Attending: Ophthalmology | Admitting: Ophthalmology

## 2019-10-12 ENCOUNTER — Other Ambulatory Visit: Payer: Self-pay | Admitting: Adult Health

## 2019-10-12 ENCOUNTER — Encounter
Admission: RE | Disposition: A | Discharge status: Home / Self Care | Payer: Self-pay | Source: Home / Self Care | Attending: Ophthalmology

## 2019-10-12 DIAGNOSIS — Z79899 Other long term (current) drug therapy: Secondary | ICD-10-CM | POA: Diagnosis not present

## 2019-10-12 DIAGNOSIS — E039 Hypothyroidism, unspecified: Secondary | ICD-10-CM

## 2019-10-12 DIAGNOSIS — Z87891 Personal history of nicotine dependence: Secondary | ICD-10-CM | POA: Diagnosis not present

## 2019-10-12 DIAGNOSIS — I10 Essential (primary) hypertension: Secondary | ICD-10-CM | POA: Insufficient documentation

## 2019-10-12 DIAGNOSIS — Z7982 Long term (current) use of aspirin: Secondary | ICD-10-CM | POA: Insufficient documentation

## 2019-10-12 DIAGNOSIS — Z79890 Hormone replacement therapy (postmenopausal): Secondary | ICD-10-CM | POA: Diagnosis not present

## 2019-10-12 DIAGNOSIS — H25811 Combined forms of age-related cataract, right eye: Secondary | ICD-10-CM | POA: Diagnosis not present

## 2019-10-12 DIAGNOSIS — E7800 Pure hypercholesterolemia, unspecified: Secondary | ICD-10-CM | POA: Diagnosis not present

## 2019-10-12 DIAGNOSIS — H2511 Age-related nuclear cataract, right eye: Secondary | ICD-10-CM | POA: Diagnosis not present

## 2019-10-12 HISTORY — PX: CATARACT EXTRACTION W/PHACO: SHX586

## 2019-10-12 HISTORY — DX: Presence of dental prosthetic device (complete) (partial): Z97.2

## 2019-10-12 HISTORY — DX: Dizziness and giddiness: R42

## 2019-10-12 HISTORY — DX: Unspecified osteoarthritis, unspecified site: M19.90

## 2019-10-12 HISTORY — DX: Hypothyroidism, unspecified: E03.9

## 2019-10-12 SURGERY — PHACOEMULSIFICATION, CATARACT, WITH IOL INSERTION
Anesthesia: General | Site: Eye | Laterality: Right

## 2019-10-12 MED ORDER — TETRACAINE 0.5 % OP SOLN OPTIME - NO CHARGE
OPHTHALMIC | Status: DC | PRN
  Administered 2019-10-12: 11:00:00 2 [drp] via OPHTHALMIC

## 2019-10-12 MED ORDER — EPINEPHRINE PF 1 MG/ML IJ SOLN
INTRAOCULAR | Status: DC | PRN
  Administered 2019-10-12: 12:00:00 71 mL via OPHTHALMIC

## 2019-10-12 MED ORDER — MOXIFLOXACIN HCL 0.5 % OP SOLN
OPHTHALMIC | Status: DC | PRN
  Administered 2019-10-12: 0.2 mL via OPHTHALMIC

## 2019-10-12 MED ORDER — ACETAMINOPHEN 160 MG/5ML PO SOLN: 325.0000 mg | ORAL | Status: DC | PRN

## 2019-10-12 MED ORDER — ACETAMINOPHEN 325 MG PO TABS: 325.0000 mg | ORAL_TABLET | ORAL | Status: DC | PRN

## 2019-10-12 MED ORDER — TRYPAN BLUE 0.06 % OP SOLN
OPHTHALMIC | Status: DC | PRN
  Administered 2019-10-12: 0.5 mL via INTRAOCULAR

## 2019-10-12 MED ORDER — TETRACAINE HCL 0.5 % OP SOLN
1.0000 [drp] | OPHTHALMIC | Status: DC | PRN
  Administered 2019-10-12 (×3): 1 [drp] via OPHTHALMIC

## 2019-10-12 MED ORDER — BRIMONIDINE TARTRATE-TIMOLOL 0.2-0.5 % OP SOLN
OPHTHALMIC | Status: DC | PRN
  Administered 2019-10-12: 1 [drp] via OPHTHALMIC

## 2019-10-12 MED ORDER — MIDAZOLAM HCL 2 MG/2ML IJ SOLN
INTRAMUSCULAR | Status: DC | PRN
  Administered 2019-10-12: 1 mg via INTRAVENOUS

## 2019-10-12 MED ORDER — LIDOCAINE HCL (PF) 2 % IJ SOLN
INTRAOCULAR | Status: DC | PRN
  Administered 2019-10-12: 11:00:00 1 mL via INTRAOCULAR

## 2019-10-12 MED ORDER — PROVISC 10 MG/ML IO SOLN
INTRAOCULAR | Status: DC | PRN
  Administered 2019-10-12: 0.55 mL via INTRAOCULAR

## 2019-10-12 MED ORDER — NA CHONDROIT SULF-NA HYALURON 40-17 MG/ML IO SOLN
INTRAOCULAR | Status: DC | PRN
  Administered 2019-10-12: 1 mL via INTRAOCULAR

## 2019-10-12 MED ORDER — FENTANYL CITRATE (PF) 100 MCG/2ML IJ SOLN
INTRAMUSCULAR | Status: DC | PRN
  Administered 2019-10-12: 50 ug via INTRAVENOUS

## 2019-10-12 MED ORDER — ARMC OPHTHALMIC DILATING DROPS
1.0000 "application " | OPHTHALMIC | Status: DC | PRN
  Administered 2019-10-12 (×3): 1 via OPHTHALMIC

## 2019-10-12 SURGICAL SUPPLY — 17 items
DISSECTOR HYDRO NUCLEUS 50X22 (MISCELLANEOUS) ×12 IMPLANT
DRSG TEGADERM 2-3/8X2-3/4 SM (GAUZE/BANDAGES/DRESSINGS) ×3 IMPLANT
GLOVE BIOGEL PI IND STRL 8 (GLOVE) ×1 IMPLANT
GLOVE BIOGEL PI INDICATOR 8 (GLOVE) ×2
GOWN STRL REUS W/ TWL LRG LVL3 (GOWN DISPOSABLE) ×1 IMPLANT
GOWN STRL REUS W/ TWL XL LVL3 (GOWN DISPOSABLE) ×1 IMPLANT
GOWN STRL REUS W/TWL LRG LVL3 (GOWN DISPOSABLE) ×2
GOWN STRL REUS W/TWL XL LVL3 (GOWN DISPOSABLE) ×2
KNIFE 45D UP 2.3 (MISCELLANEOUS) ×3 IMPLANT
LENS IOL DIOP 22.0 (Intraocular Lens) ×3 IMPLANT
LENS IOL TECNIS MONO 22.0 (Intraocular Lens) IMPLANT
PACK CATARACT (MISCELLANEOUS) ×3 IMPLANT
PACK DR. KING ARMS (PACKS) ×3 IMPLANT
PACK EYE AFTER SURG (MISCELLANEOUS) ×3 IMPLANT
SOLUTION OPHTHALMIC SALT (MISCELLANEOUS) ×3 IMPLANT
WATER STERILE IRR 250ML POUR (IV SOLUTION) ×3 IMPLANT
WIPE NON LINTING 3.25X3.25 (MISCELLANEOUS) ×3 IMPLANT

## 2019-10-12 NOTE — Op Note (Signed)
  PREOPERATIVE DIAGNOSIS:  Nuclear sclerotic cataract of the RIGHT eye.   POSTOPERATIVE DIAGNOSIS:  Nuclear sclerotic cataract of the RIGHT eye.   OPERATIVE PROCEDURE: Cataract surgery OD   SURGEON:  Elliot Cousin, MD.   ANESTHESIA:  Anesthesiologist: Baxter Flattery, MD CRNA: Maree Krabbe, CRNA  1.      Managed anesthesia care. 2.     0.46ml of Shugarcaine was instilled following the paracentesis   COMPLICATIONS:  None.   TECHNIQUE:   Divide and conquer   DESCRIPTION OF PROCEDURE:  The patient was examined and consented in the preoperative holding area where the aforementioned topical anesthesia was applied to the RIGHT eye and then brought back to the Operating Room where the RIGHT eye was prepped and draped in the usual sterile ophthalmic fashion and a lid speculum was placed. A paracentesis was created with the side port blade, the anterior chamber was washed out with trypan blue to stain the anterior capsule, and the anterior chamber was filled with viscoelastic. A near clear corneal incision was performed with the steel keratome. A continuous curvilinear capsulorrhexis was performed with a cystotome followed by the capsulorrhexis forceps. Hydrodissection and hydrodelineation were carried out with BSS on a blunt cannula. The lens was removed in a divide and conquer  technique and the remaining cortical material was removed with the irrigation-aspiration handpiece. The capsular bag was inflated with viscoelastic and the lens was placed in the capsular bag without complication. The remaining viscoelastic was removed from the eye with the irrigation-aspiration handpiece. The wounds were hydrated. The anterior chamber was flushed and the eye was inflated to physiologic pressure. 0.67ml Vigamox was placed in the anterior chamber. The wounds were found to be water tight. The eye was dressed with Vigamox. The patient was given protective glasses to wear throughout the day and a shield with which to sleep  tonight. The patient was also given drops with which to begin a drop regimen today and will follow-up with me in one day. Implant Name Type Inv. Item Serial No. Manufacturer Lot No. LRB No. Used Action  LENS IOL DIOP 22.0 - [id number] Intraocular Lens LENS IOL DIOP 22.0 [id number] AMO  Right 1 Implanted    Procedure(s): CATARACT EXTRACTION PHACO AND INTRAOCULAR LENS PLACEMENT (IOC) RIGHT VISION BLUE 10.81  00:58.8  17.9% (Right)  Electronically signed: Elliot Cousin 10/12/2019 11:35 AM

## 2019-10-12 NOTE — Transfer of Care (Signed)
Immediate Anesthesia Transfer of Care Note  Patient: Savannah Franco  Procedure(s) Performed: CATARACT EXTRACTION PHACO AND INTRAOCULAR LENS PLACEMENT (IOC) RIGHT VISION BLUE 10.81  00:58.8  17.9% (Right Eye)  Patient Location: PACU  Anesthesia Type: General  Level of Consciousness: awake, alert  and patient cooperative  Airway and Oxygen Therapy: Patient Spontanous Breathing and Patient connected to supplemental oxygen  Post-op Assessment: Post-op Vital signs reviewed, Patient's Cardiovascular Status Stable, Respiratory Function Stable, Patent Airway and No signs of Nausea or vomiting  Post-op Vital Signs: Reviewed and stable  Complications: No apparent anesthesia complications

## 2019-10-12 NOTE — H&P (Signed)
   I have reviewed the patient's H&P and agree with its findings. There have been no interval changes.  Ogden Handlin MD Ophthalmology 

## 2019-10-12 NOTE — Anesthesia Postprocedure Evaluation (Signed)
Anesthesia Post Note  Patient: Savannah Franco  Procedure(s) Performed: CATARACT EXTRACTION PHACO AND INTRAOCULAR LENS PLACEMENT (IOC) RIGHT VISION BLUE 10.81  00:58.8  17.9% (Right Eye)     Patient location during evaluation: PACU Anesthesia Type: General Level of consciousness: awake and alert Pain management: pain level controlled Vital Signs Assessment: post-procedure vital signs reviewed and stable Respiratory status: spontaneous breathing, nonlabored ventilation, respiratory function stable and patient connected to nasal cannula oxygen Cardiovascular status: stable and blood pressure returned to baseline Postop Assessment: no apparent nausea or vomiting Anesthetic complications: no    Alta Corning

## 2019-10-12 NOTE — Anesthesia Procedure Notes (Signed)
Procedure Name: MAC Date/Time: 10/12/2019 11:01 AM Performed by: Cameron Ali, CRNA Pre-anesthesia Checklist: Patient identified, Emergency Drugs available, Suction available, Timeout performed and Patient being monitored Patient Re-evaluated:Patient Re-evaluated prior to induction Oxygen Delivery Method: Nasal cannula Placement Confirmation: positive ETCO2

## 2019-10-12 NOTE — Anesthesia Preprocedure Evaluation (Signed)
Anesthesia Evaluation  Patient identified by MRN, date of birth, ID band Patient awake    Reviewed: Allergy & Precautions, H&P , NPO status , Patient's Chart, lab work & pertinent test results, reviewed documented beta blocker date and time   Airway Mallampati: III  TM Distance: >3 FB Neck ROM: full    Dental  (+) Upper Dentures   Pulmonary neg pulmonary ROS, former smoker,    Pulmonary exam normal breath sounds clear to auscultation       Cardiovascular Exercise Tolerance: Good hypertension, Normal cardiovascular exam Rhythm:regular Rate:Normal     Neuro/Psych negative neurological ROS  negative psych ROS   GI/Hepatic negative GI ROS, Neg liver ROS,   Endo/Other  Hypothyroidism   Renal/GU negative Renal ROS  negative genitourinary   Musculoskeletal   Abdominal   Peds  Hematology negative hematology ROS (+)   Anesthesia Other Findings   Reproductive/Obstetrics negative OB ROS                             Anesthesia Physical Anesthesia Plan  ASA: II  Anesthesia Plan: General   Post-op Pain Management:    Induction:   PONV Risk Score and Plan:   Airway Management Planned:   Additional Equipment:   Intra-op Plan:   Post-operative Plan:   Informed Consent: I have reviewed the patients History and Physical, chart, labs and discussed the procedure including the risks, benefits and alternatives for the proposed anesthesia with the patient or authorized representative who has indicated his/her understanding and acceptance.     Dental Advisory Given  Plan Discussed with: CRNA  Anesthesia Plan Comments:         Anesthesia Quick Evaluation

## 2019-10-13 ENCOUNTER — Encounter: Payer: Self-pay | Admitting: *Deleted

## 2019-10-13 ENCOUNTER — Other Ambulatory Visit: Payer: Self-pay

## 2019-10-13 DIAGNOSIS — I10 Essential (primary) hypertension: Secondary | ICD-10-CM

## 2019-10-13 MED ORDER — LOSARTAN POTASSIUM-HCTZ 50-12.5 MG PO TABS: 1.0000 | ORAL_TABLET | Freq: Every day | ORAL | 3 refills | Status: DC

## 2019-11-03 ENCOUNTER — Other Ambulatory Visit: Payer: Self-pay | Admitting: Nurse Practitioner

## 2019-11-03 DIAGNOSIS — Z0001 Encounter for general adult medical examination with abnormal findings: Secondary | ICD-10-CM | POA: Diagnosis not present

## 2019-11-04 LAB — COMPREHENSIVE METABOLIC PANEL
ALT: 18 IU/L (ref 0–32)
AST: 22 IU/L (ref 0–40)
Albumin/Globulin Ratio: 1.9 (ref 1.2–2.2)
Albumin: 4.7 g/dL (ref 3.8–4.8)
Alkaline Phosphatase: 80 IU/L (ref 39–117)
BUN/Creatinine Ratio: 18 (ref 12–28)
BUN: 13 mg/dL (ref 8–27)
Bilirubin Total: 0.4 mg/dL (ref 0.0–1.2)
CO2: 27 mmol/L (ref 20–29)
Calcium: 9.9 mg/dL (ref 8.7–10.3)
Chloride: 101 mmol/L (ref 96–106)
Creatinine, Ser: 0.71 mg/dL (ref 0.57–1.00)
GFR calc Af Amer: 101 mL/min/{1.73_m2} (ref >59)
GFR calc non Af Amer: 88 mL/min/{1.73_m2} (ref >59)
Globulin, Total: 2.5 g/dL (ref 1.5–4.5)
Glucose: 97 mg/dL (ref 65–99)
Potassium: 4.7 mmol/L (ref 3.5–5.2)
Sodium: 141 mmol/L (ref 134–144)
Total Protein: 7.2 g/dL (ref 6.0–8.5)

## 2019-11-04 LAB — CBC WITH DIFFERENTIAL/PLATELET
Basophils Absolute: 0.1 10*3/uL (ref 0.0–0.2)
Basos: 1 %
EOS (ABSOLUTE): 0.2 10*3/uL (ref 0.0–0.4)
Eos: 4 %
Hematocrit: 44.3 % (ref 34.0–46.6)
Hemoglobin: 15.1 g/dL (ref 11.1–15.9)
Immature Grans (Abs): 0 10*3/uL (ref 0.0–0.1)
Immature Granulocytes: 0 %
Lymphocytes Absolute: 2 10*3/uL (ref 0.7–3.1)
Lymphs: 38 %
MCH: 32.3 pg (ref 26.6–33.0)
MCHC: 34.1 g/dL (ref 31.5–35.7)
MCV: 95 fL (ref 79–97)
Monocytes Absolute: 0.4 10*3/uL (ref 0.1–0.9)
Monocytes: 7 %
Neutrophils Absolute: 2.7 10*3/uL (ref 1.4–7.0)
Neutrophils: 50 %
Platelets: 323 10*3/uL (ref 150–450)
RBC: 4.67 x10E6/uL (ref 3.77–5.28)
RDW: 12 % (ref 11.7–15.4)
WBC: 5.4 10*3/uL (ref 3.4–10.8)

## 2019-11-04 LAB — LIPID PANEL WITH LDL/HDL RATIO
Cholesterol, Total: 181 mg/dL (ref 100–199)
HDL: 52 mg/dL (ref >39)
LDL Chol Calc (NIH): 108 mg/dL — ABNORMAL HIGH (ref 0–99)
LDL/HDL Ratio: 2.1 ratio (ref 0.0–3.2)
Triglycerides: 118 mg/dL (ref 0–149)
VLDL Cholesterol Cal: 21 mg/dL (ref 5–40)

## 2019-11-04 LAB — T4, FREE: Free T4: 1.27 ng/dL (ref 0.82–1.77)

## 2019-11-04 LAB — TSH: TSH: 2.58 u[IU]/mL (ref 0.450–4.500)

## 2019-11-07 ENCOUNTER — Telehealth: Payer: Self-pay

## 2019-11-07 NOTE — Telephone Encounter (Signed)
LMOM FOR PATIENT TO CONFIRM AND SCREEN FOR 11-09-19 OV. 

## 2019-11-09 ENCOUNTER — Ambulatory Visit (INDEPENDENT_AMBULATORY_CARE_PROVIDER_SITE_OTHER): Payer: PPO | Admitting: Nurse Practitioner

## 2019-11-09 ENCOUNTER — Encounter: Payer: Self-pay | Admitting: Nurse Practitioner

## 2019-11-09 ENCOUNTER — Other Ambulatory Visit: Payer: Self-pay

## 2019-11-09 VITALS — BP 162/80 | HR 85 | Temp 98.2°F | Resp 16 | Ht 62.0 in | Wt 202.0 lb

## 2019-11-09 DIAGNOSIS — R42 Dizziness and giddiness: Secondary | ICD-10-CM | POA: Diagnosis not present

## 2019-11-09 DIAGNOSIS — Z13820 Encounter for screening for osteoporosis: Secondary | ICD-10-CM

## 2019-11-09 DIAGNOSIS — I10 Essential (primary) hypertension: Secondary | ICD-10-CM | POA: Diagnosis not present

## 2019-11-09 DIAGNOSIS — Z0001 Encounter for general adult medical examination with abnormal findings: Secondary | ICD-10-CM | POA: Diagnosis not present

## 2019-11-09 DIAGNOSIS — Z1231 Encounter for screening mammogram for malignant neoplasm of breast: Secondary | ICD-10-CM

## 2019-11-09 DIAGNOSIS — Z23 Encounter for immunization: Secondary | ICD-10-CM

## 2019-11-09 DIAGNOSIS — R300 Dysuria: Secondary | ICD-10-CM | POA: Diagnosis not present

## 2019-11-09 MED ORDER — LOSARTAN POTASSIUM-HCTZ 50-12.5 MG PO TABS: 1.5000 | ORAL_TABLET | Freq: Every day | ORAL | 3 refills | Status: DC

## 2019-11-09 MED ORDER — MECLIZINE HCL 12.5 MG PO TABS: 12.5000 mg | ORAL_TABLET | Freq: Two times a day (BID) | ORAL | 2 refills | Status: DC | PRN

## 2019-11-09 MED ORDER — PNEUMOCOCCAL 13-VAL CONJ VACC IM SUSP: 0.5000 mL | Freq: Once | INTRAMUSCULAR | 0 refills | Status: AC

## 2019-11-09 NOTE — Progress Notes (Signed)
[facility] [place], [place] [address]  Internal MEDICINE  Office Visit Note  Patient Name: Savannah Franco  [id number]  [id number]  Date of Service: 11/12/2019   Pt is here for routine health maintenance examination  Chief Complaint  Patient presents with  . Medicare Wellness  . Hypertension  . Hypothyroidism  . Hyperlipidemia  . Dizziness    last week, and washing ears out helped, every now and then has dizziness, would like to have meclizine on hand   . Headache     The patient is here for health maintenance exam. Blood pressure is moderately elevated. She states that she has had some mild headaches recently. She has also had ear wax which she has had to clean out with peroxide and uses a q tip. She denies chest pain chest pressure, and shortness of breath. She is due to have screening mammogram and bone density test. She should also have routine, fasting labs. She has not had pneumonia vaccines at this point.     Current Medication: Outpatient Encounter Medications as of 11/09/2019  Medication Sig  . aspirin 81 MG EC tablet Take 81 mg by mouth daily. Swallow whole.  Marland Kitchen atorvastatin (LIPITOR) 20 MG tablet TAKE 1 TABLET(20 MG) BY MOUTH DAILY  . B Complex Vitamins (VITAMIN B COMPLEX PO) Take by mouth.  . cholecalciferol (VITAMIN D) 1000 units tablet Take by mouth daily.  . diclofenac sodium (VOLTAREN) 1 % GEL Apply 4 g topically 4 (four) times daily.  Marland Kitchen levothyroxine (SYNTHROID) 25 MCG tablet TAKE 1 TABLET(25 MCG) BY MOUTH DAILY BEFORE BREAKFAST  . losartan-hydrochlorothiazide (HYZAAR) 50-12.5 MG tablet Take 1.5 tablets by mouth daily.  Marland Kitchen omeprazole (PRILOSEC) 40 MG capsule Take 1 capsule (40 mg total) by mouth daily.  . [DISCONTINUED] losartan-hydrochlorothiazide (HYZAAR) 50-12.5 MG tablet Take 1 tablet by mouth daily.  . [DISCONTINUED] phentermine (ADIPEX-P) 37.5 MG tablet Take 1 tablet (37.5 mg total) by mouth daily before breakfast.  .  meclizine (ANTIVERT) 12.5 MG tablet Take 1 tablet (12.5 mg total) by mouth 2 (two) times daily as needed for dizziness.  . [EXPIRED] pneumococcal 13-valent conjugate vaccine (PREVNAR 13) SUSP injection Inject 0.5 mLs into the muscle once for 1 dose.   No facility-administered encounter medications on file as of 11/09/2019.    Surgical History: Past Surgical History:  Procedure Laterality Date  . BREAST CYST ASPIRATION Right    NEG  . BREAST EXCISIONAL BIOPSY Left 2002   NEG  . CATARACT EXTRACTION W/PHACO Right 10/12/2019   Procedure: CATARACT EXTRACTION PHACO AND INTRAOCULAR LENS PLACEMENT (IOC) RIGHT VISION BLUE 10.81  00:58.8  17.9%;  Surgeon: Marchia Meiers, MD;  Location: [facility];  Service: Ophthalmology;  Laterality: Right;    Medical History: Past Medical History:  Diagnosis Date  . Arthritis    right knee  . Hyperlipidemia   . Hypertension   . Hypothyroidism   . Vertigo    "years ago"  . Wears dentures    full upper    Family History: Family History  Problem Relation Age of Onset  . Multiple myeloma Mother   . Stroke Father   . Hypertension Father   . Breast cancer Neg Hx       Review of Systems  Constitutional: Negative for activity change, chills, fatigue and unexpected weight change.       Six pound weight gain since her most recent visit   HENT: Positive for ear pain. Negative for congestion, postnasal drip, rhinorrhea,  sinus pressure, sinus pain, sneezing and sore throat.   Respiratory: Negative for cough, chest tightness, shortness of breath and wheezing.   Cardiovascular: Negative for chest pain and palpitations.       Elevated blood pressure today.   Gastrointestinal: Negative for abdominal pain, constipation, diarrhea, nausea and vomiting.  Endocrine: Negative for cold intolerance, heat intolerance, polydipsia and polyuria.  Musculoskeletal: Negative for arthralgias, back pain, joint swelling and neck pain.  Skin: Negative for rash.   Allergic/Immunologic: Positive for environmental allergies.  Neurological: Positive for dizziness. Negative for tremors, numbness and headaches.  Hematological: Negative for adenopathy. Does not bruise/bleed easily.  Psychiatric/Behavioral: Negative for behavioral problems (Depression), sleep disturbance and suicidal ideas. The patient is not nervous/anxious.      Today's Vitals   11/09/19 0955  BP: (!) 162/80  Pulse: 85  Resp: 16  Temp: 98.2 F (36.8 C)  SpO2: 98%  Weight: 202 lb (91.6 kg)  Height: 5' 2"  (1.575 m)   Body mass index is 36.95 kg/m.  Physical Exam Vitals and nursing note reviewed.  Constitutional:      General: She is not in acute distress.    Appearance: Normal appearance. She is well-developed. She is not diaphoretic.  HENT:     Head: Normocephalic and atraumatic.     Nose: Nose normal.     Mouth/Throat:     Pharynx: No oropharyngeal exudate.  Eyes:     Pupils: Pupils are equal, round, and reactive to light.  Neck:     Thyroid: No thyromegaly.     Vascular: No JVD.     Trachea: No tracheal deviation.  Cardiovascular:     Rate and Rhythm: Normal rate and regular rhythm.     Pulses: Normal pulses.     Heart sounds: Normal heart sounds. No murmur. No friction rub. No gallop.   Pulmonary:     Effort: Pulmonary effort is normal. No respiratory distress.     Breath sounds: Normal breath sounds. No wheezing or rales.  Chest:     Chest wall: No tenderness.     Breasts:        Right: Normal. No swelling, bleeding, inverted nipple, mass, nipple discharge, skin change or tenderness.        Left: Normal. No swelling, bleeding, inverted nipple, mass, nipple discharge, skin change or tenderness.  Abdominal:     General: Bowel sounds are normal.     Palpations: Abdomen is soft.     Tenderness: There is no abdominal tenderness.  Musculoskeletal:        General: Normal range of motion.     Cervical back: Normal range of motion and neck supple.   Lymphadenopathy:     Cervical: No cervical adenopathy.     Upper Body:     Right upper body: No axillary adenopathy.     Left upper body: No axillary adenopathy.  Skin:    General: Skin is warm and dry.  Neurological:     Mental Status: She is alert and oriented to person, place, and time.     Cranial Nerves: No cranial nerve deficit.  Psychiatric:        Mood and Affect: Mood normal.        Behavior: Behavior normal.        Thought Content: Thought content normal.        Judgment: Judgment normal.    Depression screen [facility] 2/9 11/09/2019 07/05/2019 06/05/2019 04/06/2019 03/09/2019  Decreased Interest 0 0 0 0 0  Down,  Depressed, Hopeless 1 0 0 0 0  PHQ - 2 Score 1 0 0 0 0    Functional Status Survey: Is the patient deaf or have difficulty hearing?: No Does the patient have difficulty seeing, even when wearing glasses/contacts?: No Does the patient have difficulty concentrating, remembering, or making decisions?: No Does the patient have difficulty walking or climbing stairs?: No Does the patient have difficulty dressing or bathing?: No Does the patient have difficulty doing errands alone such as visiting a doctor's office or shopping?: No  MMSE - [place] Exam 11/09/2019 10/14/2018  Orientation to time 5 5  Orientation to Place 5 5  Registration 3 3  Attention/ Calculation 5 5  Recall 3 3  Language- name 2 objects 2 2  Language- repeat 1 1  Language- follow 3 step command 3 3  Language- read & follow direction 1 1  Write a sentence 1 1  Copy design 1 1  Total score 30 30    Fall Risk  11/09/2019 07/05/2019 06/05/2019 04/06/2019 03/09/2019  Falls in the past year? 0 0 0 0 0  Number falls in past yr: - - - 0 -  Injury with Fall? - - - 0 -      LABS: Recent Results (from the past 2160 hour(s))  SARS CORONAVIRUS 2 (TAT 6-24 HRS) Nasopharyngeal Nasopharyngeal Swab     Status: None   Collection Time: 10/10/19 10:58 AM   Specimen: Nasopharyngeal Swab  Result Value Ref  Range   SARS Coronavirus 2 NEGATIVE NEGATIVE    Comment: (NOTE) SARS-CoV-2 target nucleic acids are NOT DETECTED. The SARS-CoV-2 RNA is generally detectable in upper and lower respiratory specimens during the acute phase of infection. Negative results do not preclude SARS-CoV-2 infection, do not rule out co-infections with other pathogens, and should not be used as the sole basis for treatment or other patient management decisions. Negative results must be combined with clinical observations, patient history, and epidemiological information. The expected result is Negative. Fact Sheet for Patients: SugarRoll.be Fact Sheet for Healthcare Providers: https://www.woods-mathews.com/ This test is not yet approved or cleared by the [place] FDA and  has been authorized for detection and/or diagnosis of SARS-CoV-2 by FDA under an Emergency Use Authorization (EUA). This EUA will remain  in effect (meaning this test can be used) for the duration of the COVID-19 declaration under Section 56 4(b)(1) of the Act, 21 U.S.C. section 360bbb-3(b)(1), unless the authorization is terminated or revoked sooner. Performed at [facility], [place] [address] [place], [place] [address]   CBC with Differential/Platelet     Status: None   Collection Time: 11/03/19  9:46 AM  Result Value Ref Range   WBC 5.4 3.4 - 10.8 x10E3/uL   RBC 4.67 3.77 - 5.28 x10E6/uL   Hemoglobin 15.1 11.1 - 15.9 g/dL   Hematocrit 44.3 34.0 - 46.6 %   MCV 95 79 - 97 fL   MCH 32.3 26.6 - 33.0 pg   MCHC 34.1 31.5 - 35.7 g/dL   RDW 12.0 11.7 - 15.4 %   Platelets 323 150 - 450 x10E3/uL   Neutrophils 50 Not Estab. %   Lymphs 38 Not Estab. %   Monocytes 7 Not Estab. %   Eos 4 Not Estab. %   Basos 1 Not Estab. %   Neutrophils Absolute 2.7 1.4 - 7.0 x10E3/uL   Lymphocytes Absolute 2.0 0.7 - 3.1 x10E3/uL   Monocytes Absolute 0.4 0.1 - 0.9 x10E3/uL   EOS (ABSOLUTE)  0.2 0.0 - 0.4  x10E3/uL   Basophils Absolute 0.1 0.0 - 0.2 x10E3/uL   Immature Granulocytes 0 Not Estab. %   Immature Grans (Abs) 0.0 0.0 - 0.1 x10E3/uL  Comprehensive metabolic panel     Status: None   Collection Time: 11/03/19  9:46 AM  Result Value Ref Range   Glucose 97 65 - 99 mg/dL   BUN 13 8 - 27 mg/dL   Creatinine, Ser 0.71 0.57 - 1.00 mg/dL   GFR calc non Af Amer 88 >59 mL/min/1.73   GFR calc Af Amer 101 >59 mL/min/1.73   BUN/Creatinine Ratio 18 12 - 28   Sodium 141 134 - 144 mmol/L   Potassium 4.7 3.5 - 5.2 mmol/L   Chloride 101 96 - 106 mmol/L   CO2 27 20 - 29 mmol/L   Calcium 9.9 8.7 - 10.3 mg/dL   Total Protein 7.2 6.0 - 8.5 g/dL   Albumin 4.7 3.8 - 4.8 g/dL   Globulin, Total 2.5 1.5 - 4.5 g/dL   Albumin/Globulin Ratio 1.9 1.2 - 2.2   Bilirubin Total 0.4 0.0 - 1.2 mg/dL   Alkaline Phosphatase 80 39 - 117 IU/L   AST 22 0 - 40 IU/L   ALT 18 0 - 32 IU/L  Lipid Panel With LDL/HDL Ratio     Status: Abnormal   Collection Time: 11/03/19  9:46 AM  Result Value Ref Range   Cholesterol, Total 181 100 - 199 mg/dL   Triglycerides 118 0 - 149 mg/dL   HDL 52 >39 mg/dL   VLDL Cholesterol Cal 21 5 - 40 mg/dL   LDL Chol Calc (NIH) 108 (H) 0 - 99 mg/dL   LDL/HDL Ratio 2.1 0.0 - 3.2 ratio    Comment:                                     LDL/HDL Ratio                                             Men  Women                               1/2 Avg.Risk  1.0    1.5                                   Avg.Risk  3.6    3.2                                2X Avg.Risk  6.2    5.0                                3X Avg.Risk  8.0    6.1   T4, free     Status: None   Collection Time: 11/03/19  9:46 AM  Result Value Ref Range   Free T4 1.27 0.82 - 1.77 ng/dL  TSH     Status: None   Collection Time: 11/03/19  9:46 AM  Result Value Ref Range   TSH 2.580 0.450 -  4.500 uIU/mL  UA/M w/rflx Culture, Routine     Status: Abnormal   Collection Time: 11/09/19 12:00 AM   Specimen: Urine   URINE  Result Value Ref  Range   Specific Gravity, UA      <=1.005 (A) 1.005 - 1.030   pH, UA 7.5 5.0 - 7.5   Color, UA Yellow Yellow   Appearance Ur Clear Clear   Leukocytes,UA 1+ (A) Negative   Protein,UA Negative Negative/Trace   Glucose, UA Negative Negative   Ketones, UA Negative Negative   RBC, UA Negative Negative   Bilirubin, UA Negative Negative   Urobilinogen, Ur 0.2 0.2 - 1.0 mg/dL   Nitrite, UA Negative Negative   Microscopic Examination See below:     Comment: Microscopic was indicated and was performed.   Urinalysis Reflex Comment     Comment: This specimen has reflexed to a Urine Culture.  Microscopic Examination     Status: None   Collection Time: 11/09/19 12:00 AM   URINE  Result Value Ref Range   WBC, UA 0-5 0 - 5 /hpf   RBC None seen 0 - 2 /hpf   Epithelial Cells (non renal) 0-10 0 - 10 /hpf   Casts None seen None seen /lpf   Bacteria, UA None seen None seen/Few  Urine Culture, Reflex     Status: None   Collection Time: 11/09/19 12:00 AM   URINE  Result Value Ref Range   Urine Culture, Routine Final report    Organism ID, Bacteria Comment     Comment: Mixed urogenital flora Less than 10,000 colonies/mL     Assessment/Plan:  1. Encounter for general adult medical examination with abnormal findings Annual health maintenance exam today. Lab slip given to have routine, fasting labs drawn.   2. Essential hypertension Increase losartan/HCTZ 50/12.38m to 1.5 tablets daily. Advised her to limit the intake of salt and increase water intake every day.  - losartan-hydrochlorothiazide (HYZAAR) 50-12.5 MG tablet; Take 1.5 tablets by mouth daily.  Dispense: 135 tablet; Refill: 3  3. Vertigo Add vertigo 12.59mtablets up to twice daily as needed.  - meclizine (ANTIVERT) 12.5 MG tablet; Take 1 tablet (12.5 mg total) by mouth 2 (two) times daily as needed for dizziness.  Dispense: 45 tablet; Refill: 2  4. Encounter for screening mammogram for malignant neoplasm of breast - MM DIGITAL  SCREENING BILATERAL; Future  5. Screening for osteoporosis - DG Bone Density; Future  6. Need for vaccination against Streptococcus pneumoniae using pneumococcal conjugate vaccine 13 Prescription for Prevnar 13 sent to her pharmacy for administration. - pneumococcal 13-valent conjugate vaccine (PREVNAR 13) SUSP injection; Inject 0.5 mLs into the muscle once for 1 dose.  Dispense: 0.5 mL; Refill: 0  7. Dysuria - UA/M w/rflx Culture, Routine   General Counseling: GlJanaeerbalizes understanding of the findings of todays visit and agrees with plan of treatment. I have discussed any further diagnostic evaluation that may be needed or ordered today. We also reviewed her medications today. she has been encouraged to call the office with any questions or concerns that should arise related to todays visit.    Counseling:  Hypertension Counseling:   The following hypertensive lifestyle modification were recommended and discussed:  1. Limiting alcohol intake to less than 1 oz/day of ethanol:(24 oz of beer or 8 oz of wine or 2 oz of 100-proof whiskey). 2. Take baby ASA 81 mg daily. 3. Importance of regular aerobic exercise and losing weight. 4. Reduce dietary saturated fat and cholesterol  intake for overall cardiovascular health. 5. Maintaining adequate dietary potassium, calcium, and magnesium intake. 6. Regular monitoring of the blood pressure. 7. Reduce sodium intake to less than 100 mmol/day (less than 2.3 gm of sodium or less than 6 gm of sodium choride)   This patient was seen by [facility] with Dr Lavera Guise as a part of collaborative care agreement  Orders Placed This Encounter  Procedures  . Microscopic Examination  . Urine Culture, Reflex  . DG Bone Density  . MM DIGITAL SCREENING BILATERAL  . UA/M w/rflx Culture, Routine    Meds ordered this encounter  Medications  . pneumococcal 13-valent conjugate vaccine (PREVNAR 13) SUSP injection    Sig: Inject  0.5 mLs into the muscle once for 1 dose.    Dispense:  0.5 mL    Refill:  0    Order Specific Question:   Supervising Provider    Answer:   Lavera Guise [[id number]]  . meclizine (ANTIVERT) 12.5 MG tablet    Sig: Take 1 tablet (12.5 mg total) by mouth 2 (two) times daily as needed for dizziness.    Dispense:  45 tablet    Refill:  2    Order Specific Question:   Supervising Provider    Answer:   Lavera Guise [[id number]]  . losartan-hydrochlorothiazide (HYZAAR) 50-12.5 MG tablet    Sig: Take 1.5 tablets by mouth daily.    Dispense:  135 tablet    Refill:  3    Please fill as 90 day prescription. Increased dose to 1.5 tablets per day    Order Specific Question:   Supervising Provider    Answer:   Lavera Guise [[id number]]    Total time spent: 49 Minutes  Time spent includes review of chart, medications, test results, and follow up plan with the patient.     Lavera Guise, MD  Internal Medicine

## 2019-11-11 LAB — UA/M W/RFLX CULTURE, ROUTINE
Bilirubin, UA: NEGATIVE
Glucose, UA: NEGATIVE
Ketones, UA: NEGATIVE
Nitrite, UA: NEGATIVE
Protein,UA: NEGATIVE
RBC, UA: NEGATIVE
Specific Gravity, UA: <=1.005 — AB (ref 1.005–1.030)
Urobilinogen, Ur: 0.2 mg/dL (ref 0.2–1.0)
pH, UA: 7.5 (ref 5.0–7.5)

## 2019-11-11 LAB — URINE CULTURE, REFLEX

## 2019-11-11 LAB — MICROSCOPIC EXAMINATION
Bacteria, UA: NONE SEEN
Casts: NONE SEEN /lpf
RBC, Urine: NONE SEEN /hpf (ref 0–2)

## 2019-11-11 NOTE — Progress Notes (Signed)
Labs great

## 2019-11-11 NOTE — Progress Notes (Signed)
Asymptomatic. 

## 2019-11-12 DIAGNOSIS — Z0001 Encounter for general adult medical examination with abnormal findings: Secondary | ICD-10-CM | POA: Insufficient documentation

## 2019-11-12 DIAGNOSIS — Z13820 Encounter for screening for osteoporosis: Secondary | ICD-10-CM | POA: Insufficient documentation

## 2019-11-12 DIAGNOSIS — R42 Dizziness and giddiness: Secondary | ICD-10-CM | POA: Insufficient documentation

## 2019-11-12 DIAGNOSIS — Z23 Encounter for immunization: Secondary | ICD-10-CM | POA: Insufficient documentation

## 2019-11-17 ENCOUNTER — Other Ambulatory Visit: Payer: Self-pay

## 2019-11-17 DIAGNOSIS — E7800 Pure hypercholesterolemia, unspecified: Secondary | ICD-10-CM

## 2019-11-17 MED ORDER — ATORVASTATIN CALCIUM 20 MG PO TABS: ORAL_TABLET | ORAL | 5 refills | Status: DC

## 2019-11-28 ENCOUNTER — Telehealth: Payer: Self-pay

## 2019-11-28 NOTE — Telephone Encounter (Signed)
CONFIRMED AND SCREENED FOR 11-30-19 OV. 

## 2019-11-30 ENCOUNTER — Encounter: Payer: Self-pay | Admitting: Nurse Practitioner

## 2019-11-30 ENCOUNTER — Ambulatory Visit (INDEPENDENT_AMBULATORY_CARE_PROVIDER_SITE_OTHER): Payer: PPO | Admitting: Nurse Practitioner

## 2019-11-30 ENCOUNTER — Other Ambulatory Visit: Payer: Self-pay

## 2019-11-30 VITALS — BP 128/88 | HR 87 | Temp 97.7°F | Resp 16 | Ht 62.0 in | Wt 203.2 lb

## 2019-11-30 DIAGNOSIS — E039 Hypothyroidism, unspecified: Secondary | ICD-10-CM | POA: Diagnosis not present

## 2019-11-30 DIAGNOSIS — Z6837 Body mass index (BMI) 37.0-37.9, adult: Secondary | ICD-10-CM

## 2019-11-30 DIAGNOSIS — Z6836 Body mass index (BMI) 36.0-36.9, adult: Secondary | ICD-10-CM | POA: Insufficient documentation

## 2019-11-30 DIAGNOSIS — I10 Essential (primary) hypertension: Secondary | ICD-10-CM | POA: Diagnosis not present

## 2019-11-30 MED ORDER — PHENTERMINE HCL 37.5 MG PO TABS: 37.5000 mg | ORAL_TABLET | Freq: Every day | ORAL | 0 refills | Status: DC

## 2019-11-30 NOTE — Progress Notes (Signed)
[facility] [address] [place],  [address]  Internal MEDICINE  Office Visit Note  Patient Name: Savannah Franco  [id number]  [id number]  Date of Service: 11/30/2019  Chief Complaint  Patient presents with  . Hyperlipidemia  . Hypothyroidism  . Hypertension  . Gastroesophageal Reflux    The patient is here for routine follow up of blood pressure. Increased losartan/HCTZ 50/12.5 to 1.5 tablets daily. She has been keeping a record of her blood pressure at home. Pressure is generally between 569 and 794 systolic. She had one reading at 801 systolic. Diastolic pressure is generally between 70 and 80. She states that she feels well and is tolerating the increased medication well. She has had a one pound weight gain since her last visit. She is interested in going back on stimulant appetite suppressant to help her lose weight. States that she would like to go down between 10 and 20 more pounds.       Current Medication: Outpatient Encounter Medications as of 11/30/2019  Medication Sig  . aspirin 81 MG EC tablet Take 81 mg by mouth daily. Swallow whole.  Marland Kitchen atorvastatin (LIPITOR) 20 MG tablet TAKE 1 TABLET(20 MG) BY MOUTH DAILY  . B Complex Vitamins (VITAMIN B COMPLEX PO) Take by mouth.  . cholecalciferol (VITAMIN D) 1000 units tablet Take by mouth daily.  . diclofenac sodium (VOLTAREN) 1 % GEL Apply 4 g topically 4 (four) times daily.  Marland Kitchen levothyroxine (SYNTHROID) 25 MCG tablet TAKE 1 TABLET(25 MCG) BY MOUTH DAILY BEFORE BREAKFAST  . losartan-hydrochlorothiazide (HYZAAR) 50-12.5 MG tablet Take 1.5 tablets by mouth daily.  . meclizine (ANTIVERT) 12.5 MG tablet Take 1 tablet (12.5 mg total) by mouth 2 (two) times daily as needed for dizziness.  Marland Kitchen omeprazole (PRILOSEC) 40 MG capsule Take 1 capsule (40 mg total) by mouth daily.  . phentermine (ADIPEX-P) 37.5 MG tablet Take 1 tablet (37.5 mg total) by mouth daily before breakfast.   No facility-administered encounter  medications on file as of 11/30/2019.    Surgical History: Past Surgical History:  Procedure Laterality Date  . BREAST CYST ASPIRATION Right    NEG  . BREAST EXCISIONAL BIOPSY Left 2002   NEG  . CATARACT EXTRACTION W/PHACO Right 10/12/2019   Procedure: CATARACT EXTRACTION PHACO AND INTRAOCULAR LENS PLACEMENT (IOC) RIGHT VISION BLUE 10.81  00:58.8  17.9%;  Surgeon: Marchia Meiers, MD;  Location: [place];  Service: Ophthalmology;  Laterality: Right;    Medical History: Past Medical History:  Diagnosis Date  . Arthritis    right knee  . Hyperlipidemia   . Hypertension   . Hypothyroidism   . Vertigo    "years ago"  . Wears dentures    full upper    Family History: Family History  Problem Relation Age of Onset  . Multiple myeloma Mother   . Stroke Father   . Hypertension Father   . Breast cancer Neg Hx     Social History   Socioeconomic History  . Marital status: Divorced    Spouse name: Not on file  . Number of children: Not on file  . Years of education: Not on file  . Highest education level: Not on file  Occupational History  . Not on file  Tobacco Use  . Smoking status: Former Smoker    Types: Cigarettes    Quit date: 2005    Years since quitting: 16.2  . Smokeless tobacco: Never Used  Substance and Sexual Activity  . Alcohol use:  Yes    Alcohol/week: 1.0 standard drinks    Types: 1 Cans of beer per week    Comment: social (1 beer/week)  . Drug use: No  . Sexual activity: Not on file  Other Topics Concern  . Not on file  Social History Narrative  . Not on file   Social Determinants of Health   Financial Resource Strain:   . Difficulty of Paying Living Expenses:   Food Insecurity:   . Worried About Charity fundraiser in the Last Year:   . Arboriculturist in the Last Year:   Transportation Needs:   . Film/video editor (Medical):   Marland Kitchen Lack of Transportation (Non-Medical):   Physical Activity:   . Days of Exercise per Week:   .  Minutes of Exercise per Session:   Stress:   . Feeling of Stress :   Social Connections:   . Frequency of Communication with Friends and Family:   . Frequency of Social Gatherings with Friends and Family:   . Attends Religious Services:   . Active Member of Clubs or Organizations:   . Attends Archivist Meetings:   Marland Kitchen Marital Status:   Intimate Partner Violence:   . Fear of Current or Ex-Partner:   . Emotionally Abused:   Marland Kitchen Physically Abused:   . Sexually Abused:       Review of Systems  Constitutional: Negative for activity change, chills, fatigue and unexpected weight change.       One pound weight gain since her last visit.  HENT: Negative for congestion, ear pain, postnasal drip, rhinorrhea, sinus pressure, sinus pain, sneezing and sore throat.   Respiratory: Negative for cough, chest tightness, shortness of breath and wheezing.   Cardiovascular: Negative for chest pain and palpitations.       Improved blood pressure  Gastrointestinal: Negative for abdominal pain, constipation, diarrhea, nausea and vomiting.  Endocrine: Negative for cold intolerance, heat intolerance, polydipsia and polyuria.  Musculoskeletal: Negative for arthralgias, back pain, joint swelling and neck pain.  Skin: Negative for rash.  Allergic/Immunologic: Positive for environmental allergies.  Neurological: Negative for dizziness, tremors, numbness and headaches.  Hematological: Negative for adenopathy. Does not bruise/bleed easily.  Psychiatric/Behavioral: Negative for behavioral problems (Depression), sleep disturbance and suicidal ideas. The patient is not nervous/anxious.     Today's Vitals   11/30/19 1017  BP: 128/88  Pulse: 87  Resp: 16  Temp: 97.7 F (36.5 C)  SpO2: 96%  Weight: 203 lb 3.2 oz (92.2 kg)  Height: 5' 2"  (1.575 m)   Body mass index is 37.17 kg/m.  Physical Exam Vitals and nursing note reviewed.  Constitutional:      General: She is not in acute distress.     Appearance: Normal appearance. She is well-developed. She is obese. She is not diaphoretic.  HENT:     Head: Normocephalic and atraumatic.     Mouth/Throat:     Pharynx: No oropharyngeal exudate.  Eyes:     Pupils: Pupils are equal, round, and reactive to light.  Neck:     Thyroid: No thyromegaly.     Vascular: No JVD.     Trachea: No tracheal deviation.  Cardiovascular:     Rate and Rhythm: Normal rate and regular rhythm.     Heart sounds: Normal heart sounds. No murmur. No friction rub. No gallop.   Pulmonary:     Effort: Pulmonary effort is normal. No respiratory distress.     Breath sounds: Normal breath  sounds. No wheezing or rales.  Chest:     Chest wall: No tenderness.  Abdominal:     Palpations: Abdomen is soft.  Musculoskeletal:        General: Normal range of motion.     Cervical back: Normal range of motion and neck supple.  Lymphadenopathy:     Cervical: No cervical adenopathy.  Skin:    General: Skin is warm and dry.  Neurological:     Mental Status: She is alert and oriented to person, place, and time.     Cranial Nerves: No cranial nerve deficit.  Psychiatric:        Behavior: Behavior normal.        Thought Content: Thought content normal.        Judgment: Judgment normal.    Assessment/Plan: 1. Essential hypertension Improved. conitnue with increased dose BP medicatoin   2. Acquired hypothyroidism Stable. Continue levothryoxine as prescribed   3. BMI 37.0-37.9, adult Start phentermine 37.58m tablets daily. Limit calorie intake to 1200-1500 calories per day and incorporate exercise into daily routine.  - phentermine (ADIPEX-P) 37.5 MG tablet; Take 1 tablet (37.5 mg total) by mouth daily before breakfast.  Dispense: 30 tablet; Refill: 0  General Counseling: GBlimaverbalizes understanding of the findings of todays visit and agrees with plan of treatment. I have discussed any further diagnostic evaluation that may be needed or ordered today. We also  reviewed her medications today. she has been encouraged to call the office with any questions or concerns that should arise related to todays visit.    There is a liability release in patients' chart. There has been a 10 minute discussion about the side effects including but not limited to elevated blood pressure, anxiety, lack of sleep and dry mouth. Pt understands and will like to start/continue on appetite suppressant at this time. There will be one month RX given at the time of visit with proper follow up. Nova diet plan with restricted calories is given to the pt. Pt understands and agrees with  plan of treatment  This patient was seen by HLeretha PolFNP Collaboration with Dr FLavera Guiseas a part of collaborative care agreement  Meds ordered this encounter  Medications  . phentermine (ADIPEX-P) 37.5 MG tablet    Sig: Take 1 tablet (37.5 mg total) by mouth daily before breakfast.    Dispense:  30 tablet    Refill:  0    Order Specific Question:   Supervising Provider    Answer:   KLavera Guise[[[id number]   Total time spent: 20 Minutes   Time spent includes review of chart, medications, test results, and follow up plan with the patient.      Dr FLavera GuiseInternal medicine

## 2019-12-20 DIAGNOSIS — Z1231 Encounter for screening mammogram for malignant neoplasm of breast: Secondary | ICD-10-CM | POA: Diagnosis not present

## 2019-12-20 DIAGNOSIS — Z780 Asymptomatic menopausal state: Secondary | ICD-10-CM | POA: Diagnosis not present

## 2019-12-20 DIAGNOSIS — E2839 Other primary ovarian failure: Secondary | ICD-10-CM | POA: Diagnosis not present

## 2019-12-20 DIAGNOSIS — N958 Other specified menopausal and perimenopausal disorders: Secondary | ICD-10-CM | POA: Diagnosis not present

## 2019-12-20 DIAGNOSIS — Z13820 Encounter for screening for osteoporosis: Secondary | ICD-10-CM | POA: Diagnosis not present

## 2019-12-27 ENCOUNTER — Telehealth: Payer: Self-pay

## 2019-12-27 NOTE — Telephone Encounter (Signed)
Confirmed and screened for 12-29-19 ov. °

## 2019-12-29 ENCOUNTER — Other Ambulatory Visit: Payer: Self-pay

## 2019-12-29 ENCOUNTER — Encounter: Payer: Self-pay | Admitting: Nurse Practitioner

## 2019-12-29 ENCOUNTER — Ambulatory Visit (INDEPENDENT_AMBULATORY_CARE_PROVIDER_SITE_OTHER): Payer: PPO | Admitting: Nurse Practitioner

## 2019-12-29 VITALS — BP 130/64 | HR 73 | Temp 97.2°F | Ht 62.0 in | Wt 198.6 lb

## 2019-12-29 DIAGNOSIS — Z6836 Body mass index (BMI) 36.0-36.9, adult: Secondary | ICD-10-CM

## 2019-12-29 DIAGNOSIS — I10 Essential (primary) hypertension: Secondary | ICD-10-CM

## 2019-12-29 DIAGNOSIS — Z6837 Body mass index (BMI) 37.0-37.9, adult: Secondary | ICD-10-CM

## 2019-12-29 MED ORDER — PHENTERMINE HCL 37.5 MG PO TABS: 37.5000 mg | ORAL_TABLET | Freq: Every day | ORAL | 0 refills | Status: DC

## 2019-12-29 NOTE — Progress Notes (Signed)
[facility] [place], [place] [address]  Internal MEDICINE  Office Visit Note  Patient Name: Savannah Franco  [id number]  [id number]  Date of Service: 01/13/2020  Chief Complaint  Patient presents with  . Follow-up    wt, management  . Hypertension  . Hyperlipidemia    The patient is here for weight management follow up. Was started on phentermine to help her manage appetite and weight. She has lost five pounds since she was last seen. Blood pressure remains well managed. She has had no negative side effects associated with taking this medication Had mammogram and bone density test done 12/20/2019. Bone density was normal. Mammogram was benign.       Current Medication: Outpatient Encounter Medications as of 12/29/2019  Medication Sig  . aspirin 81 MG EC tablet Take 81 mg by mouth daily. Swallow whole.  Marland Kitchen atorvastatin (LIPITOR) 20 MG tablet TAKE 1 TABLET(20 MG) BY MOUTH DAILY  . B Complex Vitamins (VITAMIN B COMPLEX PO) Take by mouth.  . cholecalciferol (VITAMIN D) 1000 units tablet Take by mouth daily.  . diclofenac sodium (VOLTAREN) 1 % GEL Apply 4 g topically 4 (four) times daily.  Marland Kitchen levothyroxine (SYNTHROID) 25 MCG tablet TAKE 1 TABLET(25 MCG) BY MOUTH DAILY BEFORE BREAKFAST  . meclizine (ANTIVERT) 12.5 MG tablet Take 1 tablet (12.5 mg total) by mouth 2 (two) times daily as needed for dizziness.  Marland Kitchen omeprazole (PRILOSEC) 40 MG capsule Take 1 capsule (40 mg total) by mouth daily.  . phentermine (ADIPEX-P) 37.5 MG tablet Take 1 tablet (37.5 mg total) by mouth daily before breakfast.  . [DISCONTINUED] losartan-hydrochlorothiazide (HYZAAR) 50-12.5 MG tablet Take 1.5 tablets by mouth daily.  . [DISCONTINUED] phentermine (ADIPEX-P) 37.5 MG tablet Take 1 tablet (37.5 mg total) by mouth daily before breakfast.   No facility-administered encounter medications on file as of 12/29/2019.    Surgical History: Past Surgical History:  Procedure Laterality Date   . BREAST CYST ASPIRATION Right    NEG  . BREAST EXCISIONAL BIOPSY Left 2002   NEG  . CATARACT EXTRACTION W/PHACO Right 10/12/2019   Procedure: CATARACT EXTRACTION PHACO AND INTRAOCULAR LENS PLACEMENT (IOC) RIGHT VISION BLUE 10.81  00:58.8  17.9%;  Surgeon: Marchia Meiers, MD;  Location: [facility];  Service: Ophthalmology;  Laterality: Right;    Medical History: Past Medical History:  Diagnosis Date  . Arthritis    right knee  . Hyperlipidemia   . Hypertension   . Hypothyroidism   . Vertigo    "years ago"  . Wears dentures    full upper    Family History: Family History  Problem Relation Age of Onset  . Multiple myeloma Mother   . Stroke Father   . Hypertension Father   . Breast cancer Neg Hx     Social History   Socioeconomic History  . Marital status: Divorced    Spouse name: Not on file  . Number of children: Not on file  . Years of education: Not on file  . Highest education level: Not on file  Occupational History  . Not on file  Tobacco Use  . Smoking status: Former Smoker    Types: Cigarettes    Quit date: 2005    Years since quitting: 16.3  . Smokeless tobacco: Never Used  Substance and Sexual Activity  . Alcohol use: Yes    Alcohol/week: 1.0 standard drinks    Types: 1 Cans of beer per week    Comment: social (1  beer/week)  . Drug use: No  . Sexual activity: Not on file  Other Topics Concern  . Not on file  Social History Narrative  . Not on file   Social Determinants of Health   Financial Resource Strain:   . Difficulty of Paying Living Expenses:   Food Insecurity:   . Worried About Charity fundraiser in the Last Year:   . Arboriculturist in the Last Year:   Transportation Needs:   . Film/video editor (Medical):   Marland Kitchen Lack of Transportation (Non-Medical):   Physical Activity:   . Days of Exercise per Week:   . Minutes of Exercise per Session:   Stress:   . Feeling of Stress :   Social Connections:   . Frequency of  Communication with Friends and Family:   . Frequency of Social Gatherings with Friends and Family:   . Attends Religious Services:   . Active Member of Clubs or Organizations:   . Attends Archivist Meetings:   Marland Kitchen Marital Status:   Intimate Partner Violence:   . Fear of Current or Ex-Partner:   . Emotionally Abused:   Marland Kitchen Physically Abused:   . Sexually Abused:       Review of Systems  Constitutional: Negative for chills, fatigue and unexpected weight change.       Five pound weight loss since her last visit.   HENT: Negative for congestion, postnasal drip, rhinorrhea, sneezing and sore throat.   Respiratory: Negative for cough, chest tightness, shortness of breath and wheezing.   Cardiovascular: Negative for chest pain and palpitations.  Gastrointestinal: Negative for abdominal pain, constipation, diarrhea, nausea and vomiting.  Musculoskeletal: Negative for arthralgias, back pain, joint swelling and neck pain.  Skin: Negative for rash.  Allergic/Immunologic: Negative for environmental allergies.  Neurological: Negative for dizziness, tremors, numbness and headaches.  Hematological: Negative for adenopathy. Does not bruise/bleed easily.  Psychiatric/Behavioral: Negative for behavioral problems (Depression), sleep disturbance and suicidal ideas. The patient is not nervous/anxious.     Today's Vitals   12/29/19 1150  BP: 130/64  Pulse: 73  Temp: (!) 97.2 F (36.2 C)  SpO2: 99%  Weight: 198 lb 9.6 oz (90.1 kg)  Height: 5' 2"  (1.575 m)   Body mass index is 36.32 kg/m.  Physical Exam Vitals and nursing note reviewed.  Constitutional:      General: She is not in acute distress.    Appearance: Normal appearance. She is well-developed. She is not diaphoretic.  HENT:     Head: Normocephalic and atraumatic.     Nose: Nose normal.     Mouth/Throat:     Pharynx: No oropharyngeal exudate.  Eyes:     Conjunctiva/sclera: Conjunctivae normal.     Pupils: Pupils are  equal, round, and reactive to light.  Neck:     Thyroid: No thyromegaly.     Vascular: No JVD.     Trachea: No tracheal deviation.  Cardiovascular:     Rate and Rhythm: Normal rate and regular rhythm.     Heart sounds: Normal heart sounds. No murmur. No friction rub. No gallop.   Pulmonary:     Effort: Pulmonary effort is normal. No respiratory distress.     Breath sounds: Normal breath sounds. No wheezing or rales.  Chest:     Chest wall: No tenderness.  Abdominal:     Palpations: Abdomen is soft.  Musculoskeletal:        General: Normal range of motion.  Cervical back: Normal range of motion and neck supple.  Lymphadenopathy:     Cervical: No cervical adenopathy.  Skin:    General: Skin is warm and dry.  Neurological:     Mental Status: She is alert and oriented to person, place, and time.     Cranial Nerves: No cranial nerve deficit.  Psychiatric:        Mood and Affect: Mood normal.        Behavior: Behavior normal.        Thought Content: Thought content normal.        Judgment: Judgment normal.    Assessment/Plan: 1. Essential hypertension Blood pressure stable.  Continue bp medication as prescribed   2. BMI 36.0-36.9,adult Improving. May continue phentermine 37.82m tablets. Limit calorie intake to 1200 calories per day. Incorporate exercise into daily routine.  - phentermine (ADIPEX-P) 37.5 MG tablet; Take 1 tablet (37.5 mg total) by mouth daily before breakfast.  Dispense: 30 tablet; Refill: 0  General Counseling: GVerdeneverbalizes understanding of the findings of todays visit and agrees with plan of treatment. I have discussed any further diagnostic evaluation that may be needed or ordered today. We also reviewed her medications today. she has been encouraged to call the office with any questions or concerns that should arise related to todays visit.   There is a liability release in patients' chart. There has been a 10 minute discussion about the side effects  including but not limited to elevated blood pressure, anxiety, lack of sleep and dry mouth. Pt understands and will like to start/continue on appetite suppressant at this time. There will be one month RX given at the time of visit with proper follow up. Nova diet plan with restricted calories is given to the pt. Pt understands and agrees with  plan of treatment  This patient was seen by HLeretha PolFNP Collaboration with Dr FLavera Guiseas a part of collaborative care agreement  Meds ordered this encounter  Medications  . phentermine (ADIPEX-P) 37.5 MG tablet    Sig: Take 1 tablet (37.5 mg total) by mouth daily before breakfast.    Dispense:  30 tablet    Refill:  0    Order Specific Question:   Supervising Provider    Answer:   KLavera Guise[[[id number]   Total time spent: 20 Minutes   Time spent includes review of chart, medications, test results, and follow up plan with the patient.      Dr FLavera GuiseInternal medicine

## 2020-01-11 ENCOUNTER — Other Ambulatory Visit: Payer: Self-pay

## 2020-01-11 DIAGNOSIS — I10 Essential (primary) hypertension: Secondary | ICD-10-CM

## 2020-01-11 MED ORDER — LOSARTAN POTASSIUM-HCTZ 50-12.5 MG PO TABS: 1.5000 | ORAL_TABLET | Freq: Every day | ORAL | 3 refills | Status: DC

## 2020-01-24 ENCOUNTER — Telehealth: Payer: Self-pay

## 2020-01-24 NOTE — Telephone Encounter (Signed)
Confirmed and screened for 01-26-20 ov. 

## 2020-01-26 ENCOUNTER — Ambulatory Visit (INDEPENDENT_AMBULATORY_CARE_PROVIDER_SITE_OTHER): Payer: PPO | Admitting: Nurse Practitioner

## 2020-01-26 ENCOUNTER — Encounter: Payer: Self-pay | Admitting: Nurse Practitioner

## 2020-01-26 ENCOUNTER — Other Ambulatory Visit: Payer: Self-pay

## 2020-01-26 VITALS — BP 130/72 | HR 74 | Temp 97.6°F | Resp 16 | Ht 62.0 in | Wt 198.8 lb

## 2020-01-26 DIAGNOSIS — I10 Essential (primary) hypertension: Secondary | ICD-10-CM | POA: Diagnosis not present

## 2020-01-26 DIAGNOSIS — E039 Hypothyroidism, unspecified: Secondary | ICD-10-CM

## 2020-01-26 DIAGNOSIS — Z6836 Body mass index (BMI) 36.0-36.9, adult: Secondary | ICD-10-CM

## 2020-01-26 MED ORDER — PHENTERMINE HCL 37.5 MG PO TABS: 37.5000 mg | ORAL_TABLET | Freq: Every day | ORAL | 0 refills | Status: DC

## 2020-01-26 NOTE — Progress Notes (Signed)
[facility] [place], [place] [address]  Internal MEDICINE  Office Visit Note  Patient Name: Savannah Franco  [id number]  [id number]  Date of Service: 01/31/2020  Chief Complaint  Patient presents with  . Follow-up    weight management  . Hypertension  . Hyperlipidemia  . Gastroesophageal Reflux  . Hypothyroidism    The patient is here for follow up weight management. She is taking phentermine. First month she lost five pounds. This month, her weight remained stable. She states that she is active, but does not participate in dedicated exercise. She also states that she has not made the best diet choices. She has no negative side effects associated with taking phentermine. Blood pressure is well controlled. Would like to try for additional month before breaking from the medication.       Current Medication: Outpatient Encounter Medications as of 01/26/2020  Medication Sig  . aspirin 81 MG EC tablet Take 81 mg by mouth daily. Swallow whole.  Marland Kitchen atorvastatin (LIPITOR) 20 MG tablet TAKE 1 TABLET(20 MG) BY MOUTH DAILY  . B Complex Vitamins (VITAMIN B COMPLEX PO) Take by mouth.  . cholecalciferol (VITAMIN D) 1000 units tablet Take by mouth daily.  . diclofenac sodium (VOLTAREN) 1 % GEL Apply 4 g topically 4 (four) times daily.  Marland Kitchen levothyroxine (SYNTHROID) 25 MCG tablet TAKE 1 TABLET(25 MCG) BY MOUTH DAILY BEFORE BREAKFAST  . losartan-hydrochlorothiazide (HYZAAR) 50-12.5 MG tablet Take 1.5 tablets by mouth daily.  . meclizine (ANTIVERT) 12.5 MG tablet Take 1 tablet (12.5 mg total) by mouth 2 (two) times daily as needed for dizziness.  Marland Kitchen omeprazole (PRILOSEC) 40 MG capsule Take 1 capsule (40 mg total) by mouth daily.  . phentermine (ADIPEX-P) 37.5 MG tablet Take 1 tablet (37.5 mg total) by mouth daily before breakfast.  . [DISCONTINUED] phentermine (ADIPEX-P) 37.5 MG tablet Take 1 tablet (37.5 mg total) by mouth daily before breakfast.   No  facility-administered encounter medications on file as of 01/26/2020.    Surgical History: Past Surgical History:  Procedure Laterality Date  . BREAST CYST ASPIRATION Right    NEG  . BREAST EXCISIONAL BIOPSY Left 2002   NEG  . CATARACT EXTRACTION W/PHACO Right 10/12/2019   Procedure: CATARACT EXTRACTION PHACO AND INTRAOCULAR LENS PLACEMENT (IOC) RIGHT VISION BLUE 10.81  00:58.8  17.9%;  Surgeon: Marchia Meiers, MD;  Location: [facility];  Service: Ophthalmology;  Laterality: Right;    Medical History: Past Medical History:  Diagnosis Date  . Arthritis    right knee  . Hyperlipidemia   . Hypertension   . Hypothyroidism   . Vertigo    "years ago"  . Wears dentures    full upper    Family History: Family History  Problem Relation Age of Onset  . Multiple myeloma Mother   . Stroke Father   . Hypertension Father   . Breast cancer Neg Hx     Social History   Socioeconomic History  . Marital status: Divorced    Spouse name: Not on file  . Number of children: Not on file  . Years of education: Not on file  . Highest education level: Not on file  Occupational History  . Not on file  Tobacco Use  . Smoking status: Former Smoker    Types: Cigarettes    Quit date: 2005    Years since quitting: 16.4  . Smokeless tobacco: Never Used  Substance and Sexual Activity  . Alcohol use: Yes  Alcohol/week: 1.0 standard drinks    Types: 1 Cans of beer per week    Comment: social (1 beer/week)  . Drug use: No  . Sexual activity: Not on file  Other Topics Concern  . Not on file  Social History Narrative  . Not on file   Social Determinants of Health   Financial Resource Strain:   . Difficulty of Paying Living Expenses:   Food Insecurity:   . Worried About Charity fundraiser in the Last Year:   . Arboriculturist in the Last Year:   Transportation Needs:   . Film/video editor (Medical):   Marland Kitchen Lack of Transportation (Non-Medical):   Physical Activity:   .  Days of Exercise per Week:   . Minutes of Exercise per Session:   Stress:   . Feeling of Stress :   Social Connections:   . Frequency of Communication with Friends and Family:   . Frequency of Social Gatherings with Friends and Family:   . Attends Religious Services:   . Active Member of Clubs or Organizations:   . Attends Archivist Meetings:   Marland Kitchen Marital Status:   Intimate Partner Violence:   . Fear of Current or Ex-Partner:   . Emotionally Abused:   Marland Kitchen Physically Abused:   . Sexually Abused:       Review of Systems  Constitutional: Negative for chills, fatigue and unexpected weight change.       Weight stable since her most recent visit .  HENT: Negative for congestion, postnasal drip, rhinorrhea, sneezing and sore throat.   Respiratory: Negative for cough, chest tightness, shortness of breath and wheezing.   Cardiovascular: Negative for chest pain and palpitations.  Gastrointestinal: Negative for abdominal pain, constipation, diarrhea, nausea and vomiting.  Musculoskeletal: Negative for arthralgias, back pain, joint swelling and neck pain.  Skin: Negative for rash.  Allergic/Immunologic: Negative for environmental allergies.  Neurological: Negative for dizziness, tremors, numbness and headaches.  Hematological: Negative for adenopathy. Does not bruise/bleed easily.  Psychiatric/Behavioral: Negative for behavioral problems (Depression), sleep disturbance and suicidal ideas. The patient is not nervous/anxious.     Today's Vitals   01/26/20 1128  BP: 130/72  Pulse: 74  Resp: 16  Temp: 97.6 F (36.4 C)  SpO2: 98%  Weight: 198 lb 12.8 oz (90.2 kg)  Height: _0  (1.575 m)   Body mass index is 36.36 kg/m.  Physical Exam Vitals and nursing note reviewed.  Constitutional:      General: She is not in acute distress.    Appearance: Normal appearance. She is well-developed. She is not diaphoretic.  HENT:     Head: Normocephalic and atraumatic.     Nose: Nose  normal.     Mouth/Throat:     Pharynx: No oropharyngeal exudate.  Eyes:     Conjunctiva/sclera: Conjunctivae normal.     Pupils: Pupils are equal, round, and reactive to light.  Neck:     Thyroid: No thyromegaly.     Vascular: No JVD.     Trachea: No tracheal deviation.  Cardiovascular:     Rate and Rhythm: Normal rate and regular rhythm.     Heart sounds: Normal heart sounds. No murmur. No friction rub. No gallop.   Pulmonary:     Effort: Pulmonary effort is normal. No respiratory distress.     Breath sounds: Normal breath sounds. No wheezing or rales.  Chest:     Chest wall: No tenderness.  Abdominal:  Palpations: Abdomen is soft.  Musculoskeletal:        General: Normal range of motion.     Cervical back: Normal range of motion and neck supple.  Lymphadenopathy:     Cervical: No cervical adenopathy.  Skin:    General: Skin is warm and dry.  Neurological:     Mental Status: She is alert and oriented to person, place, and time.     Cranial Nerves: No cranial nerve deficit.  Psychiatric:        Mood and Affect: Mood normal.        Behavior: Behavior normal.        Thought Content: Thought content normal.        Judgment: Judgment normal.    Assessment/Plan: 1. Essential hypertension Blood pressure stable. Continue bp medication as prescribed  2. Acquired hypothyroidism Thyroid stable. Continue levothyroxine as prescribed.   3. BMI 36.0-36.9,adult Will continue phentermine for additional 30 days. Recommend calorie intake be kept to 1200 calories per day. She should incorporate exercise into daily routine.  - phentermine (ADIPEX-P) 37.5 MG tablet; Take 1 tablet (37.5 mg total) by mouth daily before breakfast.  Dispense: 30 tablet; Refill: 0  General Counseling: Avis verbalizes understanding of the findings of todays visit and agrees with plan of treatment. I have discussed any further diagnostic evaluation that may be needed or ordered today. We also reviewed her  medications today. she has been encouraged to call the office with any questions or concerns that should arise related to todays visit.    There is a liability release in patients' chart. There has been a 10 minute discussion about the side effects including but not limited to elevated blood pressure, anxiety, lack of sleep and dry mouth. Pt understands and will like to start/continue on appetite suppressant at this time. There will be one month RX given at the time of visit with proper follow up. Nova diet plan with restricted calories is given to the pt. Pt understands and agrees with  plan of treatment  This patient was seen by Leretha Pol FNP Collaboration with Dr Lavera Guise as a part of collaborative care agreement  Meds ordered this encounter  Medications  . phentermine (ADIPEX-P) 37.5 MG tablet    Sig: Take 1 tablet (37.5 mg total) by mouth daily before breakfast.    Dispense:  30 tablet    Refill:  0    Order Specific Question:   Supervising Provider    Answer:   Lavera Guise [[id number]]    Total time spent: 25 Minutes   Time spent includes review of chart, medications, test results, and follow up plan with the patient.      Dr Lavera Guise Internal medicine

## 2020-02-21 ENCOUNTER — Telehealth: Payer: Self-pay

## 2020-02-21 NOTE — Telephone Encounter (Signed)
Confirmed and screened for 02-23-20 ov. 

## 2020-02-23 ENCOUNTER — Other Ambulatory Visit: Payer: Self-pay

## 2020-02-23 ENCOUNTER — Encounter: Payer: Self-pay | Admitting: Nurse Practitioner

## 2020-02-23 ENCOUNTER — Ambulatory Visit (INDEPENDENT_AMBULATORY_CARE_PROVIDER_SITE_OTHER): Payer: PPO | Admitting: Nurse Practitioner

## 2020-02-23 VITALS — BP 137/71 | HR 81 | Temp 97.3°F | Resp 16 | Ht 62.0 in | Wt 198.6 lb

## 2020-02-23 DIAGNOSIS — Z6836 Body mass index (BMI) 36.0-36.9, adult: Secondary | ICD-10-CM | POA: Diagnosis not present

## 2020-02-23 DIAGNOSIS — I10 Essential (primary) hypertension: Secondary | ICD-10-CM | POA: Diagnosis not present

## 2020-02-23 DIAGNOSIS — E039 Hypothyroidism, unspecified: Secondary | ICD-10-CM | POA: Diagnosis not present

## 2020-02-23 MED ORDER — PHENDIMETRAZINE TARTRATE ER 105 MG PO CP24: 1.0000 | ORAL_CAPSULE | Freq: Every day | ORAL | 0 refills | Status: DC

## 2020-02-23 NOTE — Progress Notes (Signed)
[facility] [place], [place] [address]  Internal MEDICINE  Office Visit Note  Patient Name: Savannah Franco  [id number]  [id number]  Date of Service: 03/03/2020  Chief Complaint  Patient presents with  . Follow-up    weight Mangement  . Hypertension  . Hyperlipidemia  . Arthritis    The patient is here for follow up of weight management. Her weight has remained stable over the past month. She has been limiting her calorie intake to 1200 calories per day. She is exercising more routinely.  She does not understand why she is having a difficult time losing weight. Her blood pressure is well managed. She has no new concerns or complaints.       Current Medication: Outpatient Encounter Medications as of 02/23/2020  Medication Sig  . aspirin 81 MG EC tablet Take 81 mg by mouth daily. Swallow whole.  Marland Kitchen atorvastatin (LIPITOR) 20 MG tablet TAKE 1 TABLET(20 MG) BY MOUTH DAILY  . B Complex Vitamins (VITAMIN B COMPLEX PO) Take by mouth.  . cholecalciferol (VITAMIN D) 1000 units tablet Take by mouth daily.  . diclofenac sodium (VOLTAREN) 1 % GEL Apply 4 g topically 4 (four) times daily.  Marland Kitchen losartan-hydrochlorothiazide (HYZAAR) 50-12.5 MG tablet Take 1.5 tablets by mouth daily.  . meclizine (ANTIVERT) 12.5 MG tablet Take 1 tablet (12.5 mg total) by mouth 2 (two) times daily as needed for dizziness.  Marland Kitchen omeprazole (PRILOSEC) 40 MG capsule Take 1 capsule (40 mg total) by mouth daily.  . [DISCONTINUED] levothyroxine (SYNTHROID) 25 MCG tablet TAKE 1 TABLET(25 MCG) BY MOUTH DAILY BEFORE BREAKFAST  . [DISCONTINUED] phentermine (ADIPEX-P) 37.5 MG tablet Take 1 tablet (37.5 mg total) by mouth daily before breakfast.  . Phendimetrazine Tartrate 105 MG CP24 Take 1 capsule (105 mg total) by mouth daily.   No facility-administered encounter medications on file as of 02/23/2020.    Surgical History: Past Surgical History:  Procedure Laterality Date  . BREAST CYST ASPIRATION  Right    NEG  . BREAST EXCISIONAL BIOPSY Left 2002   NEG  . CATARACT EXTRACTION W/PHACO Right 10/12/2019   Procedure: CATARACT EXTRACTION PHACO AND INTRAOCULAR LENS PLACEMENT (IOC) RIGHT VISION BLUE 10.81  00:58.8  17.9%;  Surgeon: Marchia Meiers, MD;  Location: [facility];  Service: Ophthalmology;  Laterality: Right;    Medical History: Past Medical History:  Diagnosis Date  . Arthritis    right knee  . Hyperlipidemia   . Hypertension   . Hypothyroidism   . Vertigo    "years ago"  . Wears dentures    full upper    Family History: Family History  Problem Relation Age of Onset  . Multiple myeloma Mother   . Stroke Father   . Hypertension Father   . Breast cancer Neg Hx     Social History   Socioeconomic History  . Marital status: Divorced    Spouse name: Not on file  . Number of children: Not on file  . Years of education: Not on file  . Highest education level: Not on file  Occupational History  . Not on file  Tobacco Use  . Smoking status: Former Smoker    Types: Cigarettes    Quit date: 2005    Years since quitting: 16.5  . Smokeless tobacco: Never Used  Vaping Use  . Vaping Use: Never used  Substance and Sexual Activity  . Alcohol use: Yes    Alcohol/week: 1.0 standard drink    Types: 1  Cans of beer per week    Comment: social (1 beer/week)  . Drug use: No  . Sexual activity: Not on file  Other Topics Concern  . Not on file  Social History Narrative  . Not on file   Social Determinants of Health   Financial Resource Strain:   . Difficulty of Paying Living Expenses:   Food Insecurity:   . Worried About Charity fundraiser in the Last Year:   . Arboriculturist in the Last Year:   Transportation Needs:   . Film/video editor (Medical):   Marland Kitchen Lack of Transportation (Non-Medical):   Physical Activity:   . Days of Exercise per Week:   . Minutes of Exercise per Session:   Stress:   . Feeling of Stress :   Social Connections:   .  Frequency of Communication with Friends and Family:   . Frequency of Social Gatherings with Friends and Family:   . Attends Religious Services:   . Active Member of Clubs or Organizations:   . Attends Archivist Meetings:   Marland Kitchen Marital Status:   Intimate Partner Violence:   . Fear of Current or Ex-Partner:   . Emotionally Abused:   Marland Kitchen Physically Abused:   . Sexually Abused:       Review of Systems  Constitutional: Negative for activity change, chills, fatigue and unexpected weight change.       Weight stable since her most recent visit .  HENT: Negative for congestion, postnasal drip, rhinorrhea, sneezing and sore throat.   Respiratory: Negative for cough, chest tightness, shortness of breath and wheezing.   Cardiovascular: Negative for chest pain and palpitations.  Gastrointestinal: Negative for abdominal pain, constipation, diarrhea, nausea and vomiting.  Endocrine: Negative for cold intolerance, heat intolerance, polydipsia and polyuria.  Musculoskeletal: Negative for arthralgias, back pain, joint swelling and neck pain.  Skin: Negative for rash.  Allergic/Immunologic: Negative for environmental allergies.  Neurological: Negative for dizziness, tremors, numbness and headaches.  Hematological: Negative for adenopathy. Does not bruise/bleed easily.  Psychiatric/Behavioral: Negative for behavioral problems (Depression), sleep disturbance and suicidal ideas. The patient is not nervous/anxious.     Today's Vitals   02/23/20 1140  BP: 137/71  Pulse: 81  Resp: 16  Temp: (!) 97.3 F (36.3 C)  SpO2: 97%  Weight: 198 lb 9.6 oz (90.1 kg)  Height: 5' 2"  (1.575 m)   Body mass index is 36.32 kg/m.  Physical Exam Vitals and nursing note reviewed.  Constitutional:      General: She is not in acute distress.    Appearance: Normal appearance. She is well-developed. She is not diaphoretic.  HENT:     Head: Normocephalic and atraumatic.     Nose: Nose normal.      Mouth/Throat:     Pharynx: No oropharyngeal exudate.  Eyes:     Conjunctiva/sclera: Conjunctivae normal.     Pupils: Pupils are equal, round, and reactive to light.  Neck:     Thyroid: No thyromegaly.     Vascular: No JVD.     Trachea: No tracheal deviation.  Cardiovascular:     Rate and Rhythm: Normal rate and regular rhythm.     Heart sounds: Normal heart sounds. No murmur heard.  No friction rub. No gallop.   Pulmonary:     Effort: Pulmonary effort is normal. No respiratory distress.     Breath sounds: Normal breath sounds. No wheezing or rales.  Chest:     Chest wall: No  tenderness.  Abdominal:     Palpations: Abdomen is soft.  Musculoskeletal:        General: Normal range of motion.     Cervical back: Normal range of motion and neck supple.  Lymphadenopathy:     Cervical: No cervical adenopathy.  Skin:    General: Skin is warm and dry.  Neurological:     Mental Status: She is alert and oriented to person, place, and time.     Cranial Nerves: No cranial nerve deficit.  Psychiatric:        Mood and Affect: Mood normal.        Behavior: Behavior normal.        Thought Content: Thought content normal.        Judgment: Judgment normal.    Assessment/Plan: 1. Essential hypertension Stable. Continue bp medication as prescribed.   2. BMI 36.0-36.9,adult Discontinue phentermine. Trial bontril SR 134m daily. Limit calorie intake to 1200 calories per day and incorporate exercise into daily routine.  - Phendimetrazine Tartrate 105 MG CP24; Take 1 capsule (105 mg total) by mouth daily.  Dispense: 30 capsule; Refill: 0  3. Acquired hypothyroidism Continue levothyroxine as prescribed.   General Counseling: Gbraylen denunziounderstanding of the findings of todays visit and agrees with plan of treatment. I have discussed any further diagnostic evaluation that may be needed or ordered today. We also reviewed her medications today. she has been encouraged to call the office with  any questions or concerns that should arise related to todays visit.   There is a liability release in patients' chart. There has been a 10 minute discussion about the side effects including but not limited to elevated blood pressure, anxiety, lack of sleep and dry mouth. Pt understands and will like to start/continue on appetite suppressant at this time. There will be one month RX given at the time of visit with proper follow up. Nova diet plan with restricted calories is given to the pt. Pt understands and agrees with  plan of treatment  This patient was seen by HLeretha PolFNP Collaboration with Dr FLavera Guiseas a part of collaborative care agreement  Meds ordered this encounter  Medications  . Phendimetrazine Tartrate 105 MG CP24    Sig: Take 1 capsule (105 mg total) by mouth daily.    Dispense:  30 capsule    Refill:  0    Order Specific Question:   Supervising Provider    Answer:   KLavera Guise[[[id number]   Total time spent: 25 Minutes   Time spent includes review of chart, medications, test results, and follow up plan with the patient.      Dr FLavera GuiseInternal medicine

## 2020-02-26 ENCOUNTER — Other Ambulatory Visit: Payer: Self-pay | Admitting: Adult Health

## 2020-02-26 DIAGNOSIS — E039 Hypothyroidism, unspecified: Secondary | ICD-10-CM

## 2020-03-20 ENCOUNTER — Telehealth: Payer: Self-pay

## 2020-03-20 NOTE — Telephone Encounter (Signed)
Lmom to confirm and screen for 03-22-20 ov. 

## 2020-03-22 ENCOUNTER — Ambulatory Visit: Payer: PPO | Admitting: Nurse Practitioner

## 2020-03-29 ENCOUNTER — Telehealth: Payer: Self-pay

## 2020-03-29 NOTE — Telephone Encounter (Signed)
Confirmed and screened for 04-02-20 ov. 

## 2020-04-02 ENCOUNTER — Other Ambulatory Visit: Payer: Self-pay

## 2020-04-02 ENCOUNTER — Encounter: Payer: Self-pay | Admitting: Nurse Practitioner

## 2020-04-02 ENCOUNTER — Ambulatory Visit (INDEPENDENT_AMBULATORY_CARE_PROVIDER_SITE_OTHER): Payer: PPO | Admitting: Hospice and Palliative Medicine

## 2020-04-02 DIAGNOSIS — E538 Deficiency of other specified B group vitamins: Secondary | ICD-10-CM | POA: Diagnosis not present

## 2020-04-02 DIAGNOSIS — I10 Essential (primary) hypertension: Secondary | ICD-10-CM

## 2020-04-02 DIAGNOSIS — Z6836 Body mass index (BMI) 36.0-36.9, adult: Secondary | ICD-10-CM

## 2020-04-02 DIAGNOSIS — E039 Hypothyroidism, unspecified: Secondary | ICD-10-CM | POA: Diagnosis not present

## 2020-04-02 MED ORDER — LEVOTHYROXINE SODIUM 25 MCG PO TABS: ORAL_TABLET | ORAL | 5 refills | Status: DC

## 2020-04-02 NOTE — Progress Notes (Signed)
[facility] [place], [place] [address]  Internal MEDICINE  Office Visit Note  Patient Name: Savannah Franco  [id number]  [id number]  Date of Service: 04/06/2020  Chief Complaint  Patient presents with  . Follow-up    Weight Management - has gained weight, medication is too expensive, only had half of the amount  . Hyperlipidemia  . Hypertension  . Quality Metric Gaps    TDAP    HPI Patient is here for routine follow-up. Here to follow-up on weight loss and HTN. Has been on phentermine, unable to afford full prescription at this time and was only able to fill half of prescription. Since being on phentermine she has had a one pound weight gain. She mentions she has not been focusing on diet and exercise as she knows she should. Discussed with her do to being on phentermine for some time and not having success with weight loss at this time we should discontinue medication and have her focus on diet and exercise. BP slightly elevated today, also mentioned this could be from phentermine. Continue current therapy, continue to monitor off phentermine. Requesting refills of Synthroid.  Current Medication: Outpatient Encounter Medications as of 04/02/2020  Medication Sig  . aspirin 81 MG EC tablet Take 81 mg by mouth daily. Swallow whole.  Marland Kitchen atorvastatin (LIPITOR) 20 MG tablet TAKE 1 TABLET(20 MG) BY MOUTH DAILY  . B Complex Vitamins (VITAMIN B COMPLEX PO) Take by mouth.  . cholecalciferol (VITAMIN D) 1000 units tablet Take by mouth daily.  . diclofenac sodium (VOLTAREN) 1 % GEL Apply 4 g topically 4 (four) times daily.  Marland Kitchen levothyroxine (SYNTHROID) 25 MCG tablet TAKE 1 TABLET(25 MCG) BY MOUTH DAILY BEFORE BREAKFAST  . losartan-hydrochlorothiazide (HYZAAR) 50-12.5 MG tablet Take 1.5 tablets by mouth daily.  . meclizine (ANTIVERT) 12.5 MG tablet Take 1 tablet (12.5 mg total) by mouth 2 (two) times daily as needed for dizziness.  Marland Kitchen omeprazole (PRILOSEC) 40 MG capsule  Take 1 capsule (40 mg total) by mouth daily.  . Phendimetrazine Tartrate 105 MG CP24 Take 1 capsule (105 mg total) by mouth daily.  . [DISCONTINUED] levothyroxine (SYNTHROID) 25 MCG tablet TAKE 1 TABLET(25 MCG) BY MOUTH DAILY BEFORE BREAKFAST   No facility-administered encounter medications on file as of 04/02/2020.    Surgical History: Past Surgical History:  Procedure Laterality Date  . BREAST CYST ASPIRATION Right    NEG  . BREAST EXCISIONAL BIOPSY Left 2002   NEG  . CATARACT EXTRACTION W/PHACO Right 10/12/2019   Procedure: CATARACT EXTRACTION PHACO AND INTRAOCULAR LENS PLACEMENT (IOC) RIGHT VISION BLUE 10.81  00:58.8  17.9%;  Surgeon: Marchia Meiers, MD;  Location: [place];  Service: Ophthalmology;  Laterality: Right;    Medical History: Past Medical History:  Diagnosis Date  . Arthritis    right knee  . Hyperlipidemia   . Hypertension   . Hypothyroidism   . Vertigo    "years ago"  . Wears dentures    full upper    Family History: Family History  Problem Relation Age of Onset  . Multiple myeloma Mother   . Stroke Father   . Hypertension Father   . Breast cancer Neg Hx     Social History   Socioeconomic History  . Marital status: Divorced    Spouse name: Not on file  . Number of children: Not on file  . Years of education: Not on file  . Highest education level: Not on file  Occupational History  .  Not on file  Tobacco Use  . Smoking status: Former Smoker    Types: Cigarettes    Quit date: 2005    Years since quitting: 16.6  . Smokeless tobacco: Never Used  Vaping Use  . Vaping Use: Never used  Substance and Sexual Activity  . Alcohol use: Yes    Alcohol/week: 1.0 standard drink    Types: 1 Cans of beer per week    Comment: social (1 beer/week)  . Drug use: No  . Sexual activity: Not on file  Other Topics Concern  . Not on file  Social History Narrative  . Not on file   Social Determinants of Health   Financial Resource Strain:   .  Difficulty of Paying Living Expenses:   Food Insecurity:   . Worried About Charity fundraiser in the Last Year:   . Arboriculturist in the Last Year:   Transportation Needs:   . Film/video editor (Medical):   Marland Kitchen Lack of Transportation (Non-Medical):   Physical Activity:   . Days of Exercise per Week:   . Minutes of Exercise per Session:   Stress:   . Feeling of Stress :   Social Connections:   . Frequency of Communication with Friends and Family:   . Frequency of Social Gatherings with Friends and Family:   . Attends Religious Services:   . Active Member of Clubs or Organizations:   . Attends Archivist Meetings:   Marland Kitchen Marital Status:   Intimate Partner Violence:   . Fear of Current or Ex-Partner:   . Emotionally Abused:   Marland Kitchen Physically Abused:   . Sexually Abused:       Review of Systems  Constitutional: Negative.        For chills, fever, fatigue.  HENT: Negative.        For sinus pain, sinus pressure, sore throat, trouble swallowing.  Eyes: Negative.        For changes in vision or visual disturbances.  Respiratory: Negative.        For chest tightness, cough, shortness of breath, wheezing.  Cardiovascular: Negative.        For chest pain, ankle swelling, palpitations.  Gastrointestinal: Negative.        For abdominal pain, constipation, nausea, vomiting, diarrhea.  Endocrine: Negative.        For polydipsia, polyphagia, polyuria.  Genitourinary: Negative.        For dysuria, flank pain, hematuria, increased frequency, urgency.  Musculoskeletal: Negative.        For arthralgias, myalgias, back pain, neck pain, gait disturbances.  Skin: Negative.        For rash, wound.  Allergic/Immunologic: Negative.   Neurological: Negative.        For dizziness, headaches, tremors, weakness.  Hematological: Negative.   Psychiatric/Behavioral: Negative.        For confusion, depression, anxiety, sleep disturbances.    Vital Signs: BP 134/82   Pulse 80    Temp 98 F (36.7 C)   Resp 16   Ht 5' 2"  (1.575 m)   Wt 199 lb 3.2 oz (90.4 kg)   SpO2 96%   BMI 36.43 kg/m    Physical Exam Constitutional:      Appearance: Normal appearance. She is obese.  HENT:     Nose: Nose normal.     Mouth/Throat:     Mouth: Mucous membranes are moist.     Pharynx: Oropharynx is clear.  Cardiovascular:  Rate and Rhythm: Normal rate and regular rhythm.     Pulses: Normal pulses.     Heart sounds: Normal heart sounds.  Pulmonary:     Effort: Pulmonary effort is normal.     Breath sounds: Normal breath sounds.  Abdominal:     General: Abdomen is flat. Bowel sounds are normal.     Palpations: Abdomen is soft.  Musculoskeletal:        General: Normal range of motion.     Cervical back: Normal range of motion.  Skin:    General: Skin is warm.  Neurological:     General: No focal deficit present.     Mental Status: She is alert and oriented to person, place, and time. Mental status is at baseline.  Psychiatric:        Mood and Affect: Mood normal.        Behavior: Behavior normal.        Thought Content: Thought content normal.    Assessment/Plan: 1. Acquired hypothyroidism Stable at this time, continue current therapy and monitoring. - levothyroxine (SYNTHROID) 25 MCG tablet; TAKE 1 TABLET(25 MCG) BY MOUTH DAILY BEFORE BREAKFAST  Dispense: 30 tablet; Refill: 5  2. B12 deficiency Will check B12 levels, may assist with weight loss. Has been deficient in the past. - B12  3. BMI 36.0-36.9,adult Discussed the importance of adding health food choices as well as regular exercise into her routine. Will stay off phentermine at this time and focus more on lifestyle changes. Discussed the importance of maintaining healthy choices for overall health.  4. Essential Hypertension Slightly elevated at this time, will monitor BP once off phentermine. May need to make adjustments to medications accordingly.  General Counseling: [facility]  understanding of the findings of todays visit and agrees with plan of treatment. I have discussed any further diagnostic evaluation that may be needed or ordered today. We also reviewed her medications today. she has been encouraged to call the office with any questions or concerns that should arise related to todays visit.    Orders Placed This Encounter  Procedures  . B12    Meds ordered this encounter  Medications  . levothyroxine (SYNTHROID) 25 MCG tablet    Sig: TAKE 1 TABLET(25 MCG) BY MOUTH DAILY BEFORE BREAKFAST    Dispense:  30 tablet    Refill:  5    Total time spent: 20 Minutes  This patient was seen by Theodoro Grist, AGNP-C in collaboration with Dr. Lavera Guise as part of a collaborative care agreement.  Time spent includes review of chart, medications, test results, and follow up plan with the patient.   Tanna Furry Kenton Kingfisher, AGNP-C  Dr Lavera Guise Internal medicine

## 2020-04-26 ENCOUNTER — Other Ambulatory Visit: Payer: Self-pay

## 2020-04-26 DIAGNOSIS — E7800 Pure hypercholesterolemia, unspecified: Secondary | ICD-10-CM

## 2020-04-26 MED ORDER — ATORVASTATIN CALCIUM 20 MG PO TABS: ORAL_TABLET | ORAL | 5 refills | Status: DC

## 2020-07-02 DIAGNOSIS — E538 Deficiency of other specified B group vitamins: Secondary | ICD-10-CM | POA: Diagnosis not present

## 2020-07-03 ENCOUNTER — Encounter: Payer: Self-pay | Admitting: Internal Medicine

## 2020-07-03 ENCOUNTER — Other Ambulatory Visit: Payer: Self-pay

## 2020-07-03 ENCOUNTER — Ambulatory Visit (INDEPENDENT_AMBULATORY_CARE_PROVIDER_SITE_OTHER): Payer: PPO | Admitting: Internal Medicine

## 2020-07-03 DIAGNOSIS — F0630 Mood disorder due to known physiological condition, unspecified: Secondary | ICD-10-CM

## 2020-07-03 DIAGNOSIS — Z6836 Body mass index (BMI) 36.0-36.9, adult: Secondary | ICD-10-CM

## 2020-07-03 DIAGNOSIS — E039 Hypothyroidism, unspecified: Secondary | ICD-10-CM | POA: Diagnosis not present

## 2020-07-03 LAB — VITAMIN B12: Vitamin B-12: 569 pg/mL (ref 232–1245)

## 2020-07-03 MED ORDER — BUPROPION HCL ER (XL) 150 MG PO TB24: 150.0000 mg | ORAL_TABLET | Freq: Every day | ORAL | 3 refills | Status: DC

## 2020-07-03 MED ORDER — LEVOTHYROXINE SODIUM 50 MCG PO TABS: ORAL_TABLET | ORAL | 1 refills | Status: DC

## 2020-07-03 NOTE — Progress Notes (Signed)
[facility] [place], [place] [address]  Internal MEDICINE  Office Visit Note  Patient Name: Savannah Franco  [id number]  [id number]  Date of Service: 07/10/2020  Chief Complaint  Patient presents with  . Follow-up    weight loss  . Hyperlipidemia  . Hypertension  . policy update form    reviewed    HPI Pt is here for routine follow up. She has been on weight loss program however with her age and other medical problem cannot be on appetite for long time. Her blood pressure is not well controlled either  Pt has been on no bp meds. No echocardiogram on file    Current Medication: Outpatient Encounter Medications as of 07/03/2020  Medication Sig  . aspirin 81 MG EC tablet Take 81 mg by mouth daily. Swallow whole.  Marland Kitchen atorvastatin (LIPITOR) 20 MG tablet TAKE 1 TABLET(20 MG) BY MOUTH DAILY  . B Complex Vitamins (VITAMIN B COMPLEX PO) Take by mouth.  Marland Kitchen buPROPion (WELLBUTRIN XL) 150 MG 24 hr tablet Take 1 tablet (150 mg total) by mouth daily.  . cholecalciferol (VITAMIN D) 1000 units tablet Take by mouth daily.  . diclofenac sodium (VOLTAREN) 1 % GEL Apply 4 g topically 4 (four) times daily.  Marland Kitchen levothyroxine (SYNTHROID) 50 MCG tablet TAKE 1 TABLET BY MOUTH DAILY BEFORE BREAKFAST  . losartan-hydrochlorothiazide (HYZAAR) 50-12.5 MG tablet Take 1.5 tablets by mouth daily.  . meclizine (ANTIVERT) 12.5 MG tablet Take 1 tablet (12.5 mg total) by mouth 2 (two) times daily as needed for dizziness.  Marland Kitchen omeprazole (PRILOSEC) 40 MG capsule Take 1 capsule (40 mg total) by mouth daily.  . [DISCONTINUED] levothyroxine (SYNTHROID) 25 MCG tablet TAKE 1 TABLET(25 MCG) BY MOUTH DAILY BEFORE BREAKFAST  . [DISCONTINUED] Phendimetrazine Tartrate 105 MG CP24 Take 1 capsule (105 mg total) by mouth daily.   No facility-administered encounter medications on file as of 07/03/2020.    Surgical History: Past Surgical History:  Procedure Laterality Date  . BREAST CYST ASPIRATION  Right    NEG  . BREAST EXCISIONAL BIOPSY Left 2002   NEG  . CATARACT EXTRACTION W/PHACO Right 10/12/2019   Procedure: CATARACT EXTRACTION PHACO AND INTRAOCULAR LENS PLACEMENT (IOC) RIGHT VISION BLUE 10.81  00:58.8  17.9%;  Surgeon: Marchia Meiers, MD;  Location: [place];  Service: Ophthalmology;  Laterality: Right;    Medical History: Past Medical History:  Diagnosis Date  . Arthritis    right knee  . Hyperlipidemia   . Hypertension   . Hypothyroidism   . Vertigo    "years ago"  . Wears dentures    full upper    Family History: Family History  Problem Relation Age of Onset  . Multiple myeloma Mother   . Stroke Father   . Hypertension Father   . Breast cancer Neg Hx     Social History   Socioeconomic History  . Marital status: Divorced    Spouse name: Not on file  . Number of children: Not on file  . Years of education: Not on file  . Highest education level: Not on file  Occupational History  . Not on file  Tobacco Use  . Smoking status: Former Smoker    Types: Cigarettes    Quit date: 2005    Years since quitting: 16.8  . Smokeless tobacco: Never Used  Vaping Use  . Vaping Use: Never used  Substance and Sexual Activity  . Alcohol use: Yes    Alcohol/week: 1.0 standard  drink    Types: 1 Cans of beer per week    Comment: social (1 beer/week)  . Drug use: No  . Sexual activity: Not on file  Other Topics Concern  . Not on file  Social History Narrative  . Not on file   Social Determinants of Health   Financial Resource Strain:   . Difficulty of Paying Living Expenses: Not on file  Food Insecurity:   . Worried About Charity fundraiser in the Last Year: Not on file  . Ran Out of Food in the Last Year: Not on file  Transportation Needs:   . Lack of Transportation (Medical): Not on file  . Lack of Transportation (Non-Medical): Not on file  Physical Activity:   . Days of Exercise per Week: Not on file  . Minutes of Exercise per Session: Not  on file  Stress:   . Feeling of Stress : Not on file  Social Connections:   . Frequency of Communication with Friends and Family: Not on file  . Frequency of Social Gatherings with Friends and Family: Not on file  . Attends Religious Services: Not on file  . Active Member of Clubs or Organizations: Not on file  . Attends Archivist Meetings: Not on file  . Marital Status: Not on file  Intimate Partner Violence:   . Fear of Current or Ex-Partner: Not on file  . Emotionally Abused: Not on file  . Physically Abused: Not on file  . Sexually Abused: Not on file      Review of Systems  Constitutional: Negative for chills, diaphoresis and fatigue.  HENT: Negative for ear pain, postnasal drip and sinus pressure.   Eyes: Negative for photophobia, discharge, redness, itching and visual disturbance.  Respiratory: Negative for cough, shortness of breath and wheezing.   Cardiovascular: Negative for chest pain, palpitations and leg swelling.  Gastrointestinal: Negative for abdominal pain, constipation, diarrhea, nausea and vomiting.  Genitourinary: Negative for dysuria and flank pain.  Musculoskeletal: Negative for arthralgias, back pain, gait problem and neck pain.  Skin: Negative for color change.  Allergic/Immunologic: Negative for environmental allergies and food allergies.  Neurological: Negative for dizziness and headaches.  Hematological: Does not bruise/bleed easily.  Psychiatric/Behavioral: Negative for agitation, behavioral problems (depression) and hallucinations.    Vital Signs: BP (!) 150/84   Pulse 71   Temp (!) 97.4 F (36.3 C)   Resp 16   Ht 5' 1.5" (1.562 m)   Wt 206 lb 9.6 oz (93.7 kg)   SpO2 98%   BMI 38.40 kg/m    Physical Exam Constitutional:      General: She is not in acute distress.    Appearance: She is well-developed. She is not diaphoretic.  HENT:     Head: Normocephalic and atraumatic.     Mouth/Throat:     Pharynx: No oropharyngeal  exudate.  Eyes:     Pupils: Pupils are equal, round, and reactive to light.  Neck:     Thyroid: No thyromegaly.     Vascular: No JVD.     Trachea: No tracheal deviation.  Cardiovascular:     Rate and Rhythm: Normal rate and regular rhythm.     Heart sounds: Normal heart sounds. No murmur heard.  No friction rub. No gallop.   Pulmonary:     Effort: Pulmonary effort is normal. No respiratory distress.     Breath sounds: No wheezing or rales.  Chest:     Chest wall: No tenderness.  Abdominal:     General: Bowel sounds are normal.     Palpations: Abdomen is soft.  Musculoskeletal:        General: Normal range of motion.     Cervical back: Normal range of motion and neck supple.  Lymphadenopathy:     Cervical: No cervical adenopathy.  Skin:    General: Skin is warm and dry.  Neurological:     Mental Status: She is alert and oriented to person, place, and time.     Cranial Nerves: No cranial nerve deficit.  Psychiatric:        Behavior: Behavior normal.        Thought Content: Thought content normal.        Judgment: Judgment normal.    Assessment/Plan: 1. Acquired hypothyroidism Will increase dose of synthroid to 50 mcg , TSH is slightly elevated  - levothyroxine (SYNTHROID) 50 MCG tablet; TAKE 1 TABLET BY MOUTH DAILY BEFORE BREAKFAST  Dispense: 90 tablet; Refill: 1  2. BMI 36.0-36.9,adult Obesity Counseling: Risk Assessment: An assessment of behavioral risk factors was made today and includes lack of exercise sedentary lifestyle, lack of portion control and poor dietary habits.  Risk Modification Advice: She was counseled on portion control guidelines. Restricting daily caloric intake to 1500 . The detrimental long term effects of obesity on her health and ongoing poor compliance was also discussed with the patient.  3. Mood disorder due to a general medical condition Will start wellbutrin XL qd   General Counseling: Maliya verbalizes understanding of the findings of  todays visit and agrees with plan of treatment. I have discussed any further diagnostic evaluation that may be needed or ordered today. We also reviewed her medications today. she has been encouraged to call the office with any questions or concerns that should arise related to todays visit.   Meds ordered this encounter  Medications  . levothyroxine (SYNTHROID) 50 MCG tablet    Sig: TAKE 1 TABLET BY MOUTH DAILY BEFORE BREAKFAST    Dispense:  90 tablet    Refill:  1  . buPROPion (WELLBUTRIN XL) 150 MG 24 hr tablet    Sig: Take 1 tablet (150 mg total) by mouth daily.    Dispense:  30 tablet    Refill:  3    Total time spent: 30 Minutes Time spent includes review of chart, medications, test results, and follow up plan with the patient.      Dr Lavera Guise Internal medicine

## 2020-07-18 ENCOUNTER — Other Ambulatory Visit: Payer: Self-pay

## 2020-07-18 DIAGNOSIS — I10 Essential (primary) hypertension: Secondary | ICD-10-CM

## 2020-07-18 MED ORDER — LOSARTAN POTASSIUM-HCTZ 50-12.5 MG PO TABS: 1.5000 | ORAL_TABLET | Freq: Every day | ORAL | 3 refills | Status: DC

## 2020-08-06 ENCOUNTER — Other Ambulatory Visit: Payer: Self-pay | Admitting: Internal Medicine

## 2020-08-06 ENCOUNTER — Ambulatory Visit (INDEPENDENT_AMBULATORY_CARE_PROVIDER_SITE_OTHER): Payer: PPO | Admitting: Internal Medicine

## 2020-08-06 ENCOUNTER — Other Ambulatory Visit: Payer: Self-pay

## 2020-08-06 ENCOUNTER — Encounter: Payer: Self-pay | Admitting: Internal Medicine

## 2020-08-06 VITALS — BP 156/86 | HR 95 | Temp 97.3°F | Resp 16 | Ht 61.5 in | Wt 210.0 lb

## 2020-08-06 DIAGNOSIS — Z1231 Encounter for screening mammogram for malignant neoplasm of breast: Secondary | ICD-10-CM

## 2020-08-06 DIAGNOSIS — E038 Other specified hypothyroidism: Secondary | ICD-10-CM | POA: Diagnosis not present

## 2020-08-06 DIAGNOSIS — I10 Essential (primary) hypertension: Secondary | ICD-10-CM

## 2020-08-06 DIAGNOSIS — E063 Autoimmune thyroiditis: Secondary | ICD-10-CM | POA: Diagnosis not present

## 2020-08-06 DIAGNOSIS — E782 Mixed hyperlipidemia: Secondary | ICD-10-CM | POA: Diagnosis not present

## 2020-08-06 MED ORDER — TOPIRAMATE 25 MG PO TABS: 25.0000 mg | ORAL_TABLET | Freq: Every day | ORAL | 1 refills | Status: DC

## 2020-08-06 NOTE — Progress Notes (Addendum)
[facility] [place], [place] [address]  Internal MEDICINE  Office Visit Note  Patient Name: Savannah Franco  [id number]  [id number]  Date of Service: 08/09/2020  Chief Complaint  Patient presents with  . Follow-up    hands, behind knees, and hips itchy and hot, lasted 3 days, pt thought it might be her meds and she stopped taking new meds and itching stopped   . Hypertension  . Hyperlipidemia  . policy update form    HPI  Pt is here for routine follow up. She was unable to tolerate wellbutrin, thinks might have some itching after taking it for almost 4 weeks, she thinks it made her better in her head. Blood pressure continues to be elevated, takes one tab of Losartan/hctz. 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 Feeds them twice a day. Struggles with wt. Eats 4 meals per day  Mostly carbs  Will get reports of colonoscopy from 2014 Current Medication: Outpatient Encounter Medications as of 08/06/2020  Medication Sig  . aspirin 81 MG EC tablet Take 81 mg by mouth daily. Swallow whole.  Marland Kitchen atorvastatin (LIPITOR) 20 MG tablet TAKE 1 TABLET(20 MG) BY MOUTH DAILY  . B Complex Vitamins (VITAMIN B COMPLEX PO) Take by mouth.  . cholecalciferol (VITAMIN D) 1000 units tablet Take by mouth daily.  . diclofenac sodium (VOLTAREN) 1 % GEL Apply 4 g topically 4 (four) times daily.  Marland Kitchen levothyroxine (SYNTHROID) 50 MCG tablet TAKE 1 TABLET BY MOUTH DAILY BEFORE BREAKFAST  . losartan-hydrochlorothiazide (HYZAAR) 50-12.5 MG tablet Take 1.5 tablets by mouth daily.  . meclizine (ANTIVERT) 12.5 MG tablet Take 1 tablet (12.5 mg total) by mouth 2 (two) times daily as needed for dizziness.  Marland Kitchen omeprazole (PRILOSEC) 40 MG capsule Take 1 capsule (40 mg total) by mouth daily.  . [DISCONTINUED] buPROPion (WELLBUTRIN XL) 150 MG 24 hr tablet Take 1 tablet (150 mg total) by mouth daily.  . [DISCONTINUED] topiramate (TOPAMAX) 25 MG tablet Take 1 tablet (25 mg  total) by mouth daily.   No facility-administered encounter medications on file as of 08/06/2020.    Surgical History: Past Surgical History:  Procedure Laterality Date  . BREAST CYST ASPIRATION Right    NEG  . BREAST EXCISIONAL BIOPSY Left 2002   NEG  . CATARACT EXTRACTION W/PHACO Right 10/12/2019   Procedure: CATARACT EXTRACTION PHACO AND INTRAOCULAR LENS PLACEMENT (IOC) RIGHT VISION BLUE 10.81  00:58.8  17.9%;  Surgeon: Marchia Meiers, MD;  Location: [place];  Service: Ophthalmology;  Laterality: Right;  . DENTAL SURGERY      Medical History: Past Medical History:  Diagnosis Date  . Arthritis    right knee  . Hyperlipidemia   . Hypertension   . Hypothyroidism   . Vertigo    "years ago"  . Wears dentures    full upper    Family History: Family History  Problem Relation Age of Onset  . Multiple myeloma Mother   . Stroke Father   . Hypertension Father   . Breast cancer Neg Hx     Social History   Socioeconomic History  . Marital status: Divorced    Spouse name: Not on file  . Number of children: Not on file  . Years of education: Not on file  . Highest education level: Not on file  Occupational History  . Not on file  Tobacco Use  . Smoking status: Former Smoker    Types: Cigarettes  Quit date: 2005    Years since quitting: 16.9  . Smokeless tobacco: Never Used  Vaping Use  . Vaping Use: Never used  Substance and Sexual Activity  . Alcohol use: Yes    Alcohol/week: 1.0 standard drink    Types: 1 Cans of beer per week    Comment: social (1 beer/week)  . Drug use: No  . Sexual activity: Not on file  Other Topics Concern  . Not on file  Social History Narrative  . Not on file   Social Determinants of Health   Financial Resource Strain: Not on file  Food Insecurity: Not on file  Transportation Needs: Not on file  Physical Activity: Not on file  Stress: Not on file  Social Connections: Not on file  Intimate Partner Violence: Not on  file      Review of Systems  Constitutional: Negative for chills, diaphoresis and fatigue.  HENT: Negative for ear pain, postnasal drip and sinus pressure.   Eyes: Negative for photophobia, discharge, redness, itching and visual disturbance.  Respiratory: Negative for cough, shortness of breath and wheezing.   Cardiovascular: Negative for chest pain, palpitations and leg swelling.       Elevated blood pressure   Gastrointestinal: Negative for abdominal pain, constipation, diarrhea, nausea and vomiting.  Genitourinary: Negative for dysuria and flank pain.  Musculoskeletal: Negative for arthralgias, back pain, gait problem and neck pain.  Skin: Negative for color change.  Allergic/Immunologic: Negative for environmental allergies and food allergies.  Neurological: Negative for dizziness and headaches.  Hematological: Does not bruise/bleed easily.  Psychiatric/Behavioral: Negative for agitation, behavioral problems (depression) and hallucinations.    Vital Signs: BP (!) 156/86 Comment: 162/90  Pulse 95   Temp (!) 97.3 F (36.3 C)   Resp 16   Ht 5' 1.5" (1.562 m)   Wt 210 lb (95.3 kg)   SpO2 98%   BMI 39.04 kg/m    Physical Exam Constitutional:      General: She is not in acute distress.    Appearance: She is well-developed and well-nourished. She is not diaphoretic.  HENT:     Head: Normocephalic and atraumatic.     Mouth/Throat:     Mouth: Oropharynx is clear and moist.     Pharynx: No oropharyngeal exudate.  Eyes:     Extraocular Movements: EOM normal.     Pupils: Pupils are equal, round, and reactive to light.  Neck:     Thyroid: No thyromegaly.     Vascular: No JVD.     Trachea: No tracheal deviation.  Cardiovascular:     Rate and Rhythm: Normal rate and regular rhythm.     Heart sounds: Normal heart sounds. No murmur heard. No friction rub. No gallop.   Pulmonary:     Effort: Pulmonary effort is normal. No respiratory distress.     Breath sounds: No wheezing  or rales.  Chest:     Chest wall: No tenderness.  Abdominal:     General: Bowel sounds are normal.     Palpations: Abdomen is soft.  Musculoskeletal:        General: Normal range of motion.     Cervical back: Normal range of motion and neck supple.  Lymphadenopathy:     Cervical: No cervical adenopathy.  Skin:    General: Skin is warm and dry.  Neurological:     Mental Status: She is alert and oriented to person, place, and time.     Cranial Nerves: No cranial nerve deficit.  Psychiatric:        Mood and Affect: Mood and affect normal.        Behavior: Behavior normal.        Thought Content: Thought content normal.        Judgment: Judgment normal.     Assessment/Plan: 1. Benign hypertension Continues to have elevated BP, increase Losartan/hctz 50/12.19m one and half tab a day, titrate further if indicated, might need Echocardiogram    2. Hypothyroidism due to Hashimoto's thyroiditis Continue Synthroid, will titrate if needed   3. Morbid [place] Start Topamax, intermittent fasting for behavior modification and wt control, will retry  Wellbutrin or Cymbalta if needed   4. Mixed hyperlipidemia Continue Lipitor   5. Visit for screening mammogram - MM DIGITAL SCREENING BILATERAL; Future  General Counseling: Gmaebell lyversunderstanding of the findings of todays visit and agrees with plan of treatment. I have discussed any further diagnostic evaluation that may be needed or ordered today. We also reviewed her medications today. she has been encouraged to call the office with any questions or concerns that should arise related to todays visit.  Screening Colonoscopy- I recommend this strongly. Colon cancer is the third most common cancer in both men and women and your lifetime risk of getting colon cancer is one in twenty or 5%. Getting screening colonoscopy greatly reduces your risk of getting colon cancer. It is thought that all cancers come from colon polyps and it  takes many years for a polyp to turn into a cancer so by eliminating polyps we can prevent cancers from developing. Current recommendations are for starting with colon cancer screening at age 7220or younger if there is a family history of colon cancer. If no polyps are found this can be repeated every 10 years. If polyps are found then it is recommended to repeat it in 3-5 years depending on the size and number of polyps. There are different views as to when to stop screening as it is a riskier procedure in the elderly and they stand to benefit less. The [employer] recommends stopping at age 6023though this would vary with health and life expectancy    Meds ordered this encounter  Medications  . DISCONTD: topiramate (TOPAMAX) 25 MG tablet    Sig: Take 1 tablet (25 mg total) by mouth daily.    Dispense:  30 tablet    Refill:  1  Topamax 25 mg po qhs # 90 was resent   Total time spent:  35 Minutes Time spent includes review of chart, medications, test results, and follow up plan with the patient.      Dr FLavera GuiseInternal medicine

## 2020-08-22 ENCOUNTER — Telehealth: Payer: Self-pay

## 2020-08-22 NOTE — Telephone Encounter (Signed)
Left message and asked pt to reschd 1/18 appt to 1/20 or 1/21/ Savannah Franco 

## 2020-09-17 ENCOUNTER — Ambulatory Visit: Payer: PPO | Admitting: Internal Medicine

## 2020-09-26 ENCOUNTER — Other Ambulatory Visit: Payer: Self-pay

## 2020-09-26 ENCOUNTER — Ambulatory Visit (INDEPENDENT_AMBULATORY_CARE_PROVIDER_SITE_OTHER): Payer: PPO | Admitting: Internal Medicine

## 2020-09-26 VITALS — BP 142/84 | HR 80 | Temp 98.3°F | Resp 16 | Ht 61.5 in | Wt 212.4 lb

## 2020-09-26 DIAGNOSIS — E063 Autoimmune thyroiditis: Secondary | ICD-10-CM | POA: Diagnosis not present

## 2020-09-26 DIAGNOSIS — I10 Essential (primary) hypertension: Secondary | ICD-10-CM

## 2020-09-26 DIAGNOSIS — E038 Other specified hypothyroidism: Secondary | ICD-10-CM | POA: Diagnosis not present

## 2020-09-26 DIAGNOSIS — E782 Mixed hyperlipidemia: Secondary | ICD-10-CM | POA: Diagnosis not present

## 2020-09-26 MED ORDER — PHENTERMINE HCL 15 MG PO CAPS: 15.0000 mg | ORAL_CAPSULE | ORAL | 0 refills | Status: DC

## 2020-09-26 NOTE — Progress Notes (Signed)
[facility] [place], [place] [address]  Internal MEDICINE  Office Visit Note  Patient Name: Savannah Franco  [id number]  [id number]  Date of Service: 10/07/2020  Chief Complaint  Patient presents with  . Follow-up    6 week    HPI Pt is here for routine follow up 1. Continues to struggle with wt, did not take topamax due to side effects, she was scared.  2. BP is slightly better but not well controlled  3. She did not lose any wt since last visit. 4. Will be due for labs    Current Medication: Outpatient Encounter Medications as of 09/26/2020  Medication Sig  . aspirin 81 MG EC tablet Take 81 mg by mouth daily. Swallow whole.  Marland Kitchen atorvastatin (LIPITOR) 20 MG tablet TAKE 1 TABLET(20 MG) BY MOUTH DAILY  . B Complex Vitamins (VITAMIN B COMPLEX PO) Take by mouth.  . cholecalciferol (VITAMIN D) 1000 units tablet Take by mouth daily.  . diclofenac sodium (VOLTAREN) 1 % GEL Apply 4 g topically 4 (four) times daily.  Marland Kitchen levothyroxine (SYNTHROID) 50 MCG tablet TAKE 1 TABLET BY MOUTH DAILY BEFORE BREAKFAST  . losartan-hydrochlorothiazide (HYZAAR) 50-12.5 MG tablet Take 1.5 tablets by mouth daily.  . meclizine (ANTIVERT) 12.5 MG tablet Take 1 tablet (12.5 mg total) by mouth 2 (two) times daily as needed for dizziness.  . topiramate (TOPAMAX) 25 MG tablet TAKE 1 TABLET BY MOUTH DAILY  . [DISCONTINUED] phentermine 15 MG capsule Take 1 capsule (15 mg total) by mouth every morning.  Marland Kitchen omeprazole (PRILOSEC) 40 MG capsule Take 1 capsule (40 mg total) by mouth daily. (Patient not taking: Reported on 09/26/2020)  . phentermine 15 MG capsule Take 1 capsule (15 mg total) by mouth every morning.   No facility-administered encounter medications on file as of 09/26/2020.    Surgical History: Past Surgical History:  Procedure Laterality Date  . BREAST CYST ASPIRATION Right    NEG  . BREAST EXCISIONAL BIOPSY Left 2002   NEG  . CATARACT EXTRACTION W/PHACO Right 10/12/2019    Procedure: CATARACT EXTRACTION PHACO AND INTRAOCULAR LENS PLACEMENT (IOC) RIGHT VISION BLUE 10.81  00:58.8  17.9%;  Surgeon: Marchia Meiers, MD;  Location: [place];  Service: Ophthalmology;  Laterality: Right;  . DENTAL SURGERY      Medical History: Past Medical History:  Diagnosis Date  . Arthritis    right knee  . Hyperlipidemia   . Hypertension   . Hypothyroidism   . Vertigo    "years ago"  . Wears dentures    full upper    Family History: Family History  Problem Relation Age of Onset  . Multiple myeloma Mother   . Stroke Father   . Hypertension Father   . Breast cancer Neg Hx     Social History   Socioeconomic History  . Marital status: Divorced    Spouse name: Not on file  . Number of children: Not on file  . Years of education: Not on file  . Highest education level: Not on file  Occupational History  . Not on file  Tobacco Use  . Smoking status: Former Smoker    Types: Cigarettes    Quit date: 2005    Years since quitting: 17.1  . Smokeless tobacco: Never Used  Vaping Use  . Vaping Use: Never used  Substance and Sexual Activity  . Alcohol use: Yes    Alcohol/week: 1.0 standard drink    Types: 1 Cans of  beer per week    Comment: social (1 beer/week)  . Drug use: No  . Sexual activity: Not on file  Other Topics Concern  . Not on file  Social History Narrative  . Not on file   Social Determinants of Health   Financial Resource Strain: Not on file  Food Insecurity: Not on file  Transportation Needs: Not on file  Physical Activity: Not on file  Stress: Not on file  Social Connections: Not on file  Intimate Partner Violence: Not on file      Review of Systems  Constitutional: Negative for chills, diaphoresis and fatigue.  HENT: Negative for ear pain, postnasal drip and sinus pressure.   Eyes: Negative for photophobia, discharge, redness, itching and visual disturbance.  Respiratory: Negative for cough, shortness of breath and  wheezing.   Cardiovascular: Negative for chest pain, palpitations and leg swelling.  Gastrointestinal: Negative for abdominal pain, constipation, diarrhea, nausea and vomiting.  Genitourinary: Negative for dysuria and flank pain.  Musculoskeletal: Negative for arthralgias, back pain, gait problem and neck pain.  Skin: Negative for color change.  Allergic/Immunologic: Negative for environmental allergies and food allergies.  Neurological: Negative for dizziness and headaches.  Hematological: Does not bruise/bleed easily.  Psychiatric/Behavioral: Negative for agitation, behavioral problems (depression) and hallucinations.    Vital Signs: BP (!) 142/84   Pulse 80   Temp 98.3 F (36.8 C)   Resp 16   Ht 5' 1.5" (1.562 m)   Wt 212 lb 6.4 oz (96.3 kg)   SpO2 96%   BMI 39.48 kg/m    Physical Exam Constitutional:      General: She is not in acute distress.    Appearance: She is well-developed. She is obese. She is not diaphoretic.  HENT:     Head: Normocephalic and atraumatic.     Mouth/Throat:     Pharynx: No oropharyngeal exudate.  Eyes:     Pupils: Pupils are equal, round, and reactive to light.  Neck:     Thyroid: No thyromegaly.     Vascular: No JVD.     Trachea: No tracheal deviation.  Cardiovascular:     Rate and Rhythm: Normal rate and regular rhythm.     Heart sounds: Normal heart sounds. No murmur heard. No friction rub. No gallop.   Pulmonary:     Effort: Pulmonary effort is normal. No respiratory distress.     Breath sounds: No wheezing or rales.  Chest:     Chest wall: No tenderness.  Abdominal:     General: Bowel sounds are normal.     Palpations: Abdomen is soft.  Musculoskeletal:        General: Normal range of motion.     Cervical back: Normal range of motion and neck supple.  Lymphadenopathy:     Cervical: No cervical adenopathy.  Skin:    General: Skin is warm and dry.  Neurological:     Mental Status: She is alert and oriented to person, place,  and time.     Cranial Nerves: No cranial nerve deficit.  Psychiatric:        Behavior: Behavior normal.        Thought Content: Thought content normal.        Judgment: Judgment normal.        Assessment/Plan: 1. Benign hypertension Pt is on one and half tab of Losartan/hctz 5012.5 mg po qd. Will need to be titrated up if BP is not under optimum control on next visit  -  Comprehensive metabolic panel  2. Hypothyroidism due to Hashimoto's thyroiditis Synthroid 50 mcg po qam , goal is to titrate to high therapeutic Free T4  - TSH - T4, free  3. Morbid obesity (HCC) Obesity Counseling: Risk Assessment: An assessment of behavioral risk factors was made today and includes lack of exercise sedentary lifestyle, lack of portion control and poor dietary habits. Encouraged to take Topamax low dose along with Phentermine only every 3 -4 days, no refills will be given if pt continues to have high BP and no wt loss, high risk medication,   Risk Modification Advice: She was counseled on portion control guidelines. Restricting daily caloric intake to 1500 The detrimental long term effects of obesity on her health and ongoing poor compliance was also discussed with the patient. - CBC with Differential/Platelet  4. Mixed hyperlipidemia Continue Crestor as before  - Lipid Panel With LDL/HDL Ratio  General Counseling: Marlen verbalizes understanding of the findings of todays visit and agrees with plan of treatment. I have discussed any further diagnostic evaluation that may be needed or ordered today. We also reviewed her medications today. she has been encouraged to call the office with any questions or concerns that should arise related to todays visit.    Orders Placed This Encounter  Procedures  . CBC with Differential/Platelet  . Lipid Panel With LDL/HDL Ratio  . TSH  . T4, free  . Comprehensive metabolic panel    Meds ordered this encounter  Medications  . DISCONTD: phentermine 15  MG capsule    Sig: Take 1 capsule (15 mg total) by mouth every morning.    Dispense:  30 capsule    Refill:  0  . phentermine 15 MG capsule    Sig: Take 1 capsule (15 mg total) by mouth every morning.    Dispense:  30 capsule    Refill:  0    Total time spent:30 Minutes Time spent includes review of chart, medications, test results, and follow up plan with the patient.   [place] Controlled Substance Database was reviewed by me.   Dr Lavera Guise Internal medicine

## 2020-10-07 ENCOUNTER — Encounter: Payer: Self-pay | Admitting: Internal Medicine

## 2020-10-23 ENCOUNTER — Other Ambulatory Visit: Payer: Self-pay | Admitting: Nurse Practitioner

## 2020-10-23 DIAGNOSIS — E7800 Pure hypercholesterolemia, unspecified: Secondary | ICD-10-CM

## 2020-11-12 ENCOUNTER — Other Ambulatory Visit: Payer: Self-pay | Admitting: Internal Medicine

## 2020-12-09 DIAGNOSIS — E038 Other specified hypothyroidism: Secondary | ICD-10-CM | POA: Diagnosis not present

## 2020-12-09 DIAGNOSIS — E063 Autoimmune thyroiditis: Secondary | ICD-10-CM | POA: Diagnosis not present

## 2020-12-09 DIAGNOSIS — E782 Mixed hyperlipidemia: Secondary | ICD-10-CM | POA: Diagnosis not present

## 2020-12-09 DIAGNOSIS — I10 Essential (primary) hypertension: Secondary | ICD-10-CM | POA: Diagnosis not present

## 2020-12-10 LAB — CBC WITH DIFFERENTIAL/PLATELET
Basophils Absolute: 0.1 10*3/uL (ref 0.0–0.2)
Basos: 1 %
EOS (ABSOLUTE): 0.2 10*3/uL (ref 0.0–0.4)
Eos: 4 %
Hematocrit: 43.4 % (ref 34.0–46.6)
Hemoglobin: 14.6 g/dL (ref 11.1–15.9)
Immature Grans (Abs): 0 10*3/uL (ref 0.0–0.1)
Immature Granulocytes: 0 %
Lymphocytes Absolute: 1.9 10*3/uL (ref 0.7–3.1)
Lymphs: 32 %
MCH: 31.3 pg (ref 26.6–33.0)
MCHC: 33.6 g/dL (ref 31.5–35.7)
MCV: 93 fL (ref 79–97)
Monocytes Absolute: 0.5 10*3/uL (ref 0.1–0.9)
Monocytes: 9 %
Neutrophils Absolute: 3.3 10*3/uL (ref 1.4–7.0)
Neutrophils: 54 %
Platelets: 292 10*3/uL (ref 150–450)
RBC: 4.66 x10E6/uL (ref 3.77–5.28)
RDW: 12.1 % (ref 11.7–15.4)
WBC: 6 10*3/uL (ref 3.4–10.8)

## 2020-12-10 LAB — LIPID PANEL WITH LDL/HDL RATIO
Cholesterol, Total: 182 mg/dL (ref 100–199)
HDL: 50 mg/dL (ref >39)
LDL Chol Calc (NIH): 107 mg/dL — ABNORMAL HIGH (ref 0–99)
LDL/HDL Ratio: 2.1 ratio (ref 0.0–3.2)
Triglycerides: 139 mg/dL (ref 0–149)
VLDL Cholesterol Cal: 25 mg/dL (ref 5–40)

## 2020-12-10 LAB — COMPREHENSIVE METABOLIC PANEL
ALT: 24 IU/L (ref 0–32)
AST: 25 IU/L (ref 0–40)
Albumin/Globulin Ratio: 1.7 (ref 1.2–2.2)
Albumin: 4.4 g/dL (ref 3.8–4.8)
Alkaline Phosphatase: 86 IU/L (ref 44–121)
BUN/Creatinine Ratio: 14 (ref 12–28)
BUN: 10 mg/dL (ref 8–27)
Bilirubin Total: 0.4 mg/dL (ref 0.0–1.2)
CO2: 24 mmol/L (ref 20–29)
Calcium: 9.5 mg/dL (ref 8.7–10.3)
Chloride: 102 mmol/L (ref 96–106)
Creatinine, Ser: 0.73 mg/dL (ref 0.57–1.00)
Globulin, Total: 2.6 g/dL (ref 1.5–4.5)
Glucose: 97 mg/dL (ref 65–99)
Potassium: 4.6 mmol/L (ref 3.5–5.2)
Sodium: 143 mmol/L (ref 134–144)
Total Protein: 7 g/dL (ref 6.0–8.5)
eGFR: 89 mL/min/{1.73_m2} (ref >59)

## 2020-12-10 LAB — TSH: TSH: 2.92 u[IU]/mL (ref 0.450–4.500)

## 2020-12-10 LAB — T4, FREE: Free T4: 1.16 ng/dL (ref 0.82–1.77)

## 2020-12-12 ENCOUNTER — Ambulatory Visit (INDEPENDENT_AMBULATORY_CARE_PROVIDER_SITE_OTHER): Payer: PPO | Admitting: Physician Assistant

## 2020-12-12 ENCOUNTER — Encounter: Payer: Self-pay | Admitting: Physician Assistant

## 2020-12-12 DIAGNOSIS — I10 Essential (primary) hypertension: Secondary | ICD-10-CM | POA: Diagnosis not present

## 2020-12-12 DIAGNOSIS — E063 Autoimmune thyroiditis: Secondary | ICD-10-CM

## 2020-12-12 DIAGNOSIS — Z0001 Encounter for general adult medical examination with abnormal findings: Secondary | ICD-10-CM

## 2020-12-12 DIAGNOSIS — E782 Mixed hyperlipidemia: Secondary | ICD-10-CM | POA: Diagnosis not present

## 2020-12-12 DIAGNOSIS — E038 Other specified hypothyroidism: Secondary | ICD-10-CM

## 2020-12-12 DIAGNOSIS — R300 Dysuria: Secondary | ICD-10-CM | POA: Diagnosis not present

## 2020-12-12 DIAGNOSIS — E669 Obesity, unspecified: Secondary | ICD-10-CM

## 2020-12-12 MED ORDER — LEVOTHYROXINE SODIUM 75 MCG PO TABS: 75.0000 ug | ORAL_TABLET | Freq: Every day | ORAL | 2 refills | Status: DC

## 2020-12-12 NOTE — Progress Notes (Signed)
[facility] [place], [place] [address]  Internal MEDICINE  Office Visit Note  Patient Name: Savannah Franco  [id number]  [id number]  Date of Service: 12/15/2020  Chief Complaint  Patient presents with  . Medicare Wellness  . Hypertension     HPI Pt is here for routine health maintenance examination -Checks BP at home only sometimes, usually 130s-140s. -Stopped phentermine and topamax. Was confused about the topiramate since she saw it was for seizures and didn't think she could take it without the phentermine when she ran out of that. Will consider just trying topiramate, but understands we will not restart phentermine. Pt's weight is stable from last visit but no loss made. Will need to improve diet and continue exercise. -reviewed labs showing low normal free T4. Will try increasing synthroid to higher normal and see if it also helps weight management. -colonoscopy in Dec 2014 -Pt will call to schedule mammogram -had bone density last year that was normal  -States she exercises a lot  Current Medication: Outpatient Encounter Medications as of 12/12/2020  Medication Sig  . aspirin 81 MG EC tablet Take 81 mg by mouth daily. Swallow whole.  Marland Kitchen atorvastatin (LIPITOR) 20 MG tablet TAKE 1 TABLET(20 MG) BY MOUTH DAILY  . B Complex Vitamins (VITAMIN B COMPLEX PO) Take by mouth.  . cholecalciferol (VITAMIN D) 1000 units tablet Take by mouth daily.  . diclofenac sodium (VOLTAREN) 1 % GEL Apply 4 g topically 4 (four) times daily.  Marland Kitchen levothyroxine (SYNTHROID) 75 MCG tablet Take 1 tablet (75 mcg total) by mouth daily before breakfast.  . losartan-hydrochlorothiazide (HYZAAR) 50-12.5 MG tablet Take 1.5 tablets by mouth daily.  . meclizine (ANTIVERT) 12.5 MG tablet Take 1 tablet (12.5 mg total) by mouth 2 (two) times daily as needed for dizziness.  . [DISCONTINUED] levothyroxine (SYNTHROID) 50 MCG tablet TAKE 1 TABLET BY MOUTH DAILY BEFORE BREAKFAST  .  [DISCONTINUED] atorvastatin (LIPITOR) 20 MG tablet TAKE 1 TABLET(20 MG) BY MOUTH DAILY (Patient not taking: Reported on 12/12/2020)  . [DISCONTINUED] omeprazole (PRILOSEC) 40 MG capsule Take 1 capsule (40 mg total) by mouth daily. (Patient not taking: No sig reported)  . [DISCONTINUED] phentermine 15 MG capsule Take 1 capsule (15 mg total) by mouth every morning. (Patient not taking: Reported on 12/12/2020)  . [DISCONTINUED] topiramate (TOPAMAX) 25 MG tablet TAKE 1 TABLET BY MOUTH DAILY (Patient not taking: Reported on 12/12/2020)   No facility-administered encounter medications on file as of 12/12/2020.    Surgical History: Past Surgical History:  Procedure Laterality Date  . BREAST CYST ASPIRATION Right    NEG  . BREAST EXCISIONAL BIOPSY Left 2002   NEG  . CATARACT EXTRACTION W/PHACO Right 10/12/2019   Procedure: CATARACT EXTRACTION PHACO AND INTRAOCULAR LENS PLACEMENT (IOC) RIGHT VISION BLUE 10.81  00:58.8  17.9%;  Surgeon: Marchia Meiers, MD;  Location: [place];  Service: Ophthalmology;  Laterality: Right;  . DENTAL SURGERY      Medical History: Past Medical History:  Diagnosis Date  . Arthritis    right knee  . Hyperlipidemia   . Hypertension   . Hypothyroidism   . Vertigo    "years ago"  . Wears dentures    full upper    Family History: Family History  Problem Relation Age of Onset  . Multiple myeloma Mother   . Stroke Father   . Hypertension Father   . Breast cancer Neg Hx       Review of Systems  Constitutional:  Negative for chills, diaphoresis and fatigue.  HENT: Negative for ear pain, postnasal drip and sinus pressure.   Eyes: Negative for photophobia, discharge, redness, itching and visual disturbance.  Respiratory: Negative for cough, shortness of breath and wheezing.   Cardiovascular: Negative for chest pain, palpitations and leg swelling.  Gastrointestinal: Negative for abdominal pain, constipation, diarrhea, nausea and vomiting.  Genitourinary:  Negative for dysuria and flank pain.  Musculoskeletal: Positive for arthralgias. Negative for back pain, gait problem and neck pain.  Skin: Negative for color change.  Allergic/Immunologic: Negative for environmental allergies and food allergies.  Neurological: Negative for dizziness and headaches.  Hematological: Does not bruise/bleed easily.  Psychiatric/Behavioral: Negative for agitation, behavioral problems (depression) and hallucinations.     Vital Signs: BP (!) 142/84   Pulse 84   Temp 98.4 F (36.9 C)   Resp 16   Ht 5' 1.5" (1.562 m)   Wt 212 lb 3.2 oz (96.3 kg)   SpO2 96%   BMI 39.45 kg/m    Physical Exam Vitals and nursing note reviewed.  Constitutional:      General: She is not in acute distress.    Appearance: She is well-developed. She is obese. She is not diaphoretic.  HENT:     Head: Normocephalic and atraumatic.     Right Ear: External ear normal.     Left Ear: External ear normal.     Nose: Nose normal.     Mouth/Throat:     Pharynx: No oropharyngeal exudate.  Eyes:     General: No scleral icterus.       Right eye: No discharge.        Left eye: No discharge.     Conjunctiva/sclera: Conjunctivae normal.     Pupils: Pupils are equal, round, and reactive to light.  Neck:     Thyroid: No thyromegaly.     Vascular: No JVD.     Trachea: No tracheal deviation.  Cardiovascular:     Rate and Rhythm: Normal rate and regular rhythm.     Heart sounds: Normal heart sounds. No murmur heard. No friction rub. No gallop.   Pulmonary:     Effort: Pulmonary effort is normal. No respiratory distress.     Breath sounds: Normal breath sounds. No stridor. No wheezing or rales.  Chest:     Chest wall: No tenderness.  Abdominal:     General: Bowel sounds are normal. There is no distension.     Palpations: Abdomen is soft. There is no mass.     Tenderness: There is no abdominal tenderness. There is no guarding or rebound.  Musculoskeletal:        General: No  tenderness or deformity. Normal range of motion.     Cervical back: Normal range of motion and neck supple.  Lymphadenopathy:     Cervical: No cervical adenopathy.  Skin:    General: Skin is warm and dry.     Coloration: Skin is not pale.     Findings: No erythema or rash.  Neurological:     Mental Status: She is alert.     Cranial Nerves: No cranial nerve deficit.     Motor: No abnormal muscle tone.     Coordination: Coordination normal.     Deep Tendon Reflexes: Reflexes are normal and symmetric.  Psychiatric:        Behavior: Behavior normal.        Thought Content: Thought content normal.        Judgment: Judgment normal.  LABS: Recent Results (from the past 2160 hour(s))  CBC with Differential/Platelet     Status: None   Collection Time: 12/09/20 10:16 AM  Result Value Ref Range   WBC 6.0 3.4 - 10.8 x10E3/uL   RBC 4.66 3.77 - 5.28 x10E6/uL   Hemoglobin 14.6 11.1 - 15.9 g/dL   Hematocrit 43.4 34.0 - 46.6 %   MCV 93 79 - 97 fL   MCH 31.3 26.6 - 33.0 pg   MCHC 33.6 31.5 - 35.7 g/dL   RDW 12.1 11.7 - 15.4 %   Platelets 292 150 - 450 x10E3/uL   Neutrophils 54 Not Estab. %   Lymphs 32 Not Estab. %   Monocytes 9 Not Estab. %   Eos 4 Not Estab. %   Basos 1 Not Estab. %   Neutrophils Absolute 3.3 1.4 - 7.0 x10E3/uL   Lymphocytes Absolute 1.9 0.7 - 3.1 x10E3/uL   Monocytes Absolute 0.5 0.1 - 0.9 x10E3/uL   EOS (ABSOLUTE) 0.2 0.0 - 0.4 x10E3/uL   Basophils Absolute 0.1 0.0 - 0.2 x10E3/uL   Immature Granulocytes 0 Not Estab. %   Immature Grans (Abs) 0.0 0.0 - 0.1 x10E3/uL  Lipid Panel With LDL/HDL Ratio     Status: Abnormal   Collection Time: 12/09/20 10:16 AM  Result Value Ref Range   Cholesterol, Total 182 100 - 199 mg/dL   Triglycerides 139 0 - 149 mg/dL   HDL 50 >39 mg/dL   VLDL Cholesterol Cal 25 5 - 40 mg/dL   LDL Chol Calc (NIH) 107 (H) 0 - 99 mg/dL   LDL/HDL Ratio 2.1 0.0 - 3.2 ratio    Comment:                                     LDL/HDL Ratio                                              Men  Women                               1/2 Avg.Risk  1.0    1.5                                   Avg.Risk  3.6    3.2                                2X Avg.Risk  6.2    5.0                                3X Avg.Risk  8.0    6.1   TSH     Status: None   Collection Time: 12/09/20 10:16 AM  Result Value Ref Range   TSH 2.920 0.450 - 4.500 uIU/mL  T4, free     Status: None   Collection Time: 12/09/20 10:16 AM  Result Value Ref Range   Free T4 1.16 0.82 - 1.77 ng/dL  Comprehensive metabolic panel     Status: None   Collection Time: 12/09/20 10:16  AM  Result Value Ref Range   Glucose 97 65 - 99 mg/dL   BUN 10 8 - 27 mg/dL   Creatinine, Ser 0.73 0.57 - 1.00 mg/dL   eGFR 89 >59 mL/min/1.73   BUN/Creatinine Ratio 14 12 - 28   Sodium 143 134 - 144 mmol/L   Potassium 4.6 3.5 - 5.2 mmol/L   Chloride 102 96 - 106 mmol/L   CO2 24 20 - 29 mmol/L   Calcium 9.5 8.7 - 10.3 mg/dL   Total Protein 7.0 6.0 - 8.5 g/dL   Albumin 4.4 3.8 - 4.8 g/dL   Globulin, Total 2.6 1.5 - 4.5 g/dL   Albumin/Globulin Ratio 1.7 1.2 - 2.2   Bilirubin Total 0.4 0.0 - 1.2 mg/dL   Alkaline Phosphatase 86 44 - 121 IU/L   AST 25 0 - 40 IU/L   ALT 24 0 - 32 IU/L  UA/M w/rflx Culture, Routine     Status: None   Collection Time: 12/12/20  1:40 AM   Specimen: Urine   Urine  Result Value Ref Range   Specific Gravity, UA 1.012 1.005 - 1.030   pH, UA 6.0 5.0 - 7.5   Color, UA Yellow Yellow   Appearance Ur Clear Clear   Leukocytes,UA Negative Negative   Protein,UA Negative Negative/Trace   Glucose, UA Negative Negative   Ketones, UA Negative Negative   RBC, UA Negative Negative   Bilirubin, UA Negative Negative   Urobilinogen, Ur 0.2 0.2 - 1.0 mg/dL   Nitrite, UA Negative Negative   Microscopic Examination Comment     Comment: Microscopic follows if indicated.   Microscopic Examination See below:     Comment: Microscopic was indicated and was performed.   Urinalysis Reflex  Comment     Comment: This specimen will not reflex to a Urine Culture.  Microscopic Examination     Status: None   Collection Time: 12/12/20  1:40 AM   Urine  Result Value Ref Range   WBC, UA 0-5 0 - 5 /hpf   RBC None seen 0 - 2 /hpf   Epithelial Cells (non renal) None seen 0 - 10 /hpf   Casts None seen None seen /lpf   Bacteria, UA None seen None seen/Few        Assessment/Plan: 1. Encounter for general adult medical examination with abnormal findings Reviewed recent labs. Pt will schedule mammogram, UTD on BMD and colonoscopy  2. Hypothyroidism due to Hashimoto's thyroiditis Will increase synthroid to 75mg to aim for high normal free T4. Will recheck labs in 2 months. - levothyroxine (SYNTHROID) 75 MCG tablet; Take 1 tablet (75 mcg total) by mouth daily before breakfast.  Dispense: 30 tablet; Refill: 2 - TSH + free T4  3. Essential hypertension Mildly elevated. Continue losartan/hctz.  4. Mixed hyperlipidemia Continue Crestor--Labs stable  5. Obesity (BMI 35.0-39.9 without comorbidity) May try restarting topamax to help with wt loss. Will also increase synthroid to see if low-normal T4 contributing to difficulty losing weight. Pt will work on diet and continue exercising.  6. Dysuria - UA/M w/rflx Culture, Routine   General Counseling: GSengverbalizes understanding of the findings of todays visit and agrees with plan of treatment. I have discussed any further diagnostic evaluation that may be needed or ordered today. We also reviewed her medications today. she has been encouraged to call the office with any questions or concerns that should arise related to todays visit.    Counseling:    Orders Placed This Encounter  Procedures  .  Microscopic Examination  . TSH + free T4  . UA/M w/rflx Culture, Routine    Meds ordered this encounter  Medications  . levothyroxine (SYNTHROID) 75 MCG tablet    Sig: Take 1 tablet (75 mcg total) by mouth daily before  breakfast.    Dispense:  30 tablet    Refill:  2    This patient was seen by Drema Dallas, PA-C in collaboration with Dr. Clayborn Bigness as a part of collaborative care agreement.  Total time spent:30 Minutes  Time spent includes review of chart, medications, test results, and follow up plan with the patient.     Lavera Guise, MD  Internal Medicine

## 2020-12-13 LAB — MICROSCOPIC EXAMINATION
Bacteria, UA: NONE SEEN
Casts: NONE SEEN /lpf
Epithelial Cells (non renal): NONE SEEN /hpf (ref 0–10)
RBC, Urine: NONE SEEN /hpf (ref 0–2)

## 2020-12-13 LAB — UA/M W/RFLX CULTURE, ROUTINE
Bilirubin, UA: NEGATIVE
Glucose, UA: NEGATIVE
Ketones, UA: NEGATIVE
Leukocytes,UA: NEGATIVE
Nitrite, UA: NEGATIVE
Protein,UA: NEGATIVE
RBC, UA: NEGATIVE
Specific Gravity, UA: 1.012 (ref 1.005–1.030)
Urobilinogen, Ur: 0.2 mg/dL (ref 0.2–1.0)
pH, UA: 6 (ref 5.0–7.5)

## 2020-12-15 NOTE — Patient Instructions (Signed)

## 2021-01-02 ENCOUNTER — Other Ambulatory Visit: Payer: Self-pay | Admitting: Internal Medicine

## 2021-01-02 DIAGNOSIS — E039 Hypothyroidism, unspecified: Secondary | ICD-10-CM

## 2021-03-07 DIAGNOSIS — E063 Autoimmune thyroiditis: Secondary | ICD-10-CM | POA: Diagnosis not present

## 2021-03-07 DIAGNOSIS — E038 Other specified hypothyroidism: Secondary | ICD-10-CM | POA: Diagnosis not present

## 2021-03-08 LAB — TSH+FREE T4
Free T4: 1.39 ng/dL (ref 0.82–1.77)
TSH: 0.897 u[IU]/mL (ref 0.450–4.500)

## 2021-03-11 ENCOUNTER — Telehealth: Payer: Self-pay | Admitting: Internal Medicine

## 2021-03-11 NOTE — Chronic Care Management (AMB) (Signed)
  Chronic Care Management   Outreach Note  03/11/2021 Name: Savannah Franco MRN: [id number] DOB: [birth year]  Referred by: Lyndon Code, MD Reason for referral : No chief complaint on file.   An unsuccessful telephone outreach was attempted today. The patient was referred to the pharmacist for assistance with care management and care coordination.   Follow Up Plan:   Tatjana Dellinger Upstream Scheduler

## 2021-03-12 ENCOUNTER — Telehealth: Payer: Self-pay

## 2021-03-12 NOTE — Telephone Encounter (Signed)
Left vm to screen for 03/13/21 appointment-Toni 

## 2021-03-13 ENCOUNTER — Encounter: Payer: Self-pay | Admitting: Physician Assistant

## 2021-03-13 ENCOUNTER — Ambulatory Visit (INDEPENDENT_AMBULATORY_CARE_PROVIDER_SITE_OTHER): Payer: PPO | Admitting: Physician Assistant

## 2021-03-13 ENCOUNTER — Other Ambulatory Visit: Payer: Self-pay

## 2021-03-13 DIAGNOSIS — Z6841 Body Mass Index (BMI) 40.0 and over, adult: Secondary | ICD-10-CM | POA: Diagnosis not present

## 2021-03-13 DIAGNOSIS — E038 Other specified hypothyroidism: Secondary | ICD-10-CM | POA: Diagnosis not present

## 2021-03-13 DIAGNOSIS — E063 Autoimmune thyroiditis: Secondary | ICD-10-CM | POA: Diagnosis not present

## 2021-03-13 DIAGNOSIS — I10 Essential (primary) hypertension: Secondary | ICD-10-CM | POA: Diagnosis not present

## 2021-03-13 DIAGNOSIS — E782 Mixed hyperlipidemia: Secondary | ICD-10-CM

## 2021-03-13 MED ORDER — LEVOTHYROXINE SODIUM 75 MCG PO TABS
75.0000 ug | ORAL_TABLET | Freq: Every day | ORAL | 1 refills | Status: DC
Start: 2021-03-13 — End: 2021-09-05

## 2021-03-13 MED ORDER — TOPIRAMATE 25 MG PO TABS: 25.0000 mg | ORAL_TABLET | Freq: Two times a day (BID) | ORAL | 1 refills | Status: DC

## 2021-03-13 NOTE — Progress Notes (Signed)
[facility] [place], [place] [address]  Internal MEDICINE  Office Visit Note  Patient Name: Savannah Franco  [id number]  [id number]  Date of Service: 03/17/2021  Chief Complaint  Patient presents with   Follow-up    Med refill, thyroids, wt loss, review lab     HPI Pt is here for routine follow up -She has been working on losing weight, but hasnt had success. Pt is up 9lbs since last visit 3 months ago. She has not been taking any medications to aid wt loss.  Had discussed trying topiramate but pt states she did not understand that she could take this despite not continuing on phentermine. -Since increasing synthroid she has noticed a difference--feels much better and labs in appropriate range -BP not checked at home, high in office but states its been a bad day, her phone died and had to buy a new phone which was costly and then construction on roads nearly made her late. Also got locked out of her car and had to crawl through the back due to door jam. -Still needs to schedule mammogram  Current Medication: Outpatient Encounter Medications as of 03/13/2021  Medication Sig   aspirin 81 MG EC tablet Take 81 mg by mouth daily. Swallow whole.   atorvastatin (LIPITOR) 20 MG tablet TAKE 1 TABLET(20 MG) BY MOUTH DAILY   B Complex Vitamins (VITAMIN B COMPLEX PO) Take by mouth.   cholecalciferol (VITAMIN D) 1000 units tablet Take by mouth daily.   diclofenac sodium (VOLTAREN) 1 % GEL Apply 4 g topically 4 (four) times daily.   losartan-hydrochlorothiazide (HYZAAR) 50-12.5 MG tablet Take 1.5 tablets by mouth daily.   meclizine (ANTIVERT) 12.5 MG tablet Take 1 tablet (12.5 mg total) by mouth 2 (two) times daily as needed for dizziness.   Tdap (ADACEL) 12-30-13.5 LF-MCG/0.5 injection Inject 0.5 mLs into the muscle once.   topiramate (TOPAMAX) 25 MG tablet Take 1 tablet (25 mg total) by mouth 2 (two) times daily.   [DISCONTINUED] levothyroxine (SYNTHROID) 75 MCG tablet  Take 1 tablet (75 mcg total) by mouth daily before breakfast.   levothyroxine (SYNTHROID) 75 MCG tablet Take 1 tablet (75 mcg total) by mouth daily before breakfast.   No facility-administered encounter medications on file as of 03/13/2021.    Surgical History: Past Surgical History:  Procedure Laterality Date   BREAST CYST ASPIRATION Right    NEG   BREAST EXCISIONAL BIOPSY Left 2002   NEG   CATARACT EXTRACTION W/PHACO Right 10/12/2019   Procedure: CATARACT EXTRACTION PHACO AND INTRAOCULAR LENS PLACEMENT (IOC) RIGHT VISION BLUE 10.81  00:58.8  17.9%;  Surgeon: Marchia Meiers, MD;  Location: [place];  Service: Ophthalmology;  Laterality: Right;   DENTAL SURGERY      Medical History: Past Medical History:  Diagnosis Date   Arthritis    right knee   Hyperlipidemia    Hypertension    Hypothyroidism    Vertigo    "years ago"   Wears dentures    full upper    Family History: Family History  Problem Relation Age of Onset   Multiple myeloma Mother    Stroke Father    Hypertension Father    Breast cancer Neg Hx     Social History   Socioeconomic History   Marital status: Divorced    Spouse name: Not on file   Number of children: Not on file   Years of education: Not on file   Highest education level: Not  on file  Occupational History   Not on file  Tobacco Use   Smoking status: Former    Types: Cigarettes    Quit date: 2005    Years since quitting: 17.5   Smokeless tobacco: Never  Vaping Use   Vaping Use: Never used  Substance and Sexual Activity   Alcohol use: Yes    Alcohol/week: 1.0 standard drink    Types: 1 Cans of beer per week    Comment: social (1 beer/week)   Drug use: No   Sexual activity: Not on file  Other Topics Concern   Not on file  Social History Narrative   Not on file   Social Determinants of Health   Financial Resource Strain: Not on file  Food Insecurity: Not on file  Transportation Needs: Not on file  Physical Activity:  Not on file  Stress: Not on file  Social Connections: Not on file  Intimate Partner Violence: Not on file      Review of Systems  Constitutional:  Negative for chills, fatigue and unexpected weight change.  HENT:  Negative for congestion, postnasal drip, rhinorrhea, sneezing and sore throat.   Eyes:  Negative for redness.  Respiratory:  Negative for cough, chest tightness, shortness of breath and wheezing.   Cardiovascular:  Negative for chest pain and palpitations.  Gastrointestinal:  Negative for abdominal pain, constipation, diarrhea, nausea and vomiting.  Genitourinary:  Negative for dysuria and frequency.  Musculoskeletal:  Negative for arthralgias, back pain, joint swelling and neck pain.  Skin:  Negative for rash.  Neurological: Negative.  Negative for tremors and numbness.  Hematological:  Negative for adenopathy. Does not bruise/bleed easily.  Psychiatric/Behavioral:  Negative for behavioral problems (Depression), sleep disturbance and suicidal ideas. The patient is not nervous/anxious.    Vital Signs: BP (!) 142/94   Pulse 78   Temp 98.7 F (37.1 C)   Resp 16   Ht 5' 1"  (1.549 m)   Wt 221 lb 3.2 oz (100.3 kg)   SpO2 96%   BMI 41.80 kg/m    Physical Exam Vitals and nursing note reviewed.  Constitutional:      General: She is not in acute distress.    Appearance: She is well-developed. She is obese. She is not diaphoretic.  HENT:     Head: Normocephalic and atraumatic.     Mouth/Throat:     Pharynx: No oropharyngeal exudate.  Eyes:     Pupils: Pupils are equal, round, and reactive to light.  Neck:     Thyroid: No thyromegaly.     Vascular: No JVD.     Trachea: No tracheal deviation.  Cardiovascular:     Rate and Rhythm: Normal rate and regular rhythm.     Heart sounds: Normal heart sounds. No murmur heard.   No friction rub. No gallop.  Pulmonary:     Effort: Pulmonary effort is normal. No respiratory distress.     Breath sounds: No wheezing or rales.   Chest:     Chest wall: No tenderness.  Abdominal:     General: Bowel sounds are normal.     Palpations: Abdomen is soft.  Musculoskeletal:        General: Normal range of motion.     Cervical back: Normal range of motion and neck supple.  Lymphadenopathy:     Cervical: No cervical adenopathy.  Skin:    General: Skin is warm and dry.  Neurological:     Mental Status: She is alert and oriented to  person, place, and time.     Cranial Nerves: No cranial nerve deficit.  Psychiatric:        Behavior: Behavior normal.        Thought Content: Thought content normal.        Judgment: Judgment normal.       Assessment/Plan: 1. Essential hypertension Mildly elevated in office, but pt has had a stressful day. Will continue current medications and have pt star checking at home. Will re-evaluate next visit  2. Hypothyroidism due to Hashimoto's thyroiditis Stable on increased dose, continue synthroid 71mg - levothyroxine (SYNTHROID) 75 MCG tablet; Take 1 tablet (75 mcg total) by mouth daily before breakfast.  Dispense: 90 tablet; Refill: 1  3. Mixed hyperlipidemia Continue lipitor  4. Morbid obesity with BMI of 40.0-44.9, [place] Will start on topiramate to aid wt loss and continue to improve diet/exercise Obesity Counseling: Had a lengthy discussion regarding patients BMI and weight issues. Patient was instructed on portion control as well as increased activity. Also discussed caloric restrictions with trying to maintain intake less than 2000 Kcal. Discussions were made in accordance with the 5As of weight management. Simple actions such as not eating late and if able to, taking a walk is suggested. - topiramate (TOPAMAX) 25 MG tablet; Take 1 tablet (25 mg total) by mouth 2 (two) times daily.  Dispense: 30 tablet; Refill: 1   General Counseling: GMoriyahverbalizes understanding of the findings of todays visit and agrees with plan of treatment. I have discussed any further diagnostic  evaluation that may be needed or ordered today. We also reviewed her medications today. she has been encouraged to call the office with any questions or concerns that should arise related to todays visit.    No orders of the defined types were placed in this encounter.   Meds ordered this encounter  Medications   levothyroxine (SYNTHROID) 75 MCG tablet    Sig: Take 1 tablet (75 mcg total) by mouth daily before breakfast.    Dispense:  90 tablet    Refill:  1   topiramate (TOPAMAX) 25 MG tablet    Sig: Take 1 tablet (25 mg total) by mouth 2 (two) times daily.    Dispense:  30 tablet    Refill:  1     This patient was seen by LDrema Dallas PA-C in collaboration with Dr. FClayborn Bignessas a part of collaborative care agreement.   Total time spent:35 Minutes Time spent includes review of chart, medications, test results, and follow up plan with the patient.      Dr FLavera GuiseInternal medicine

## 2021-03-17 NOTE — Patient Instructions (Signed)
Obesity, Adult Obesity is having too much body fat. Being obese means that your weight is morethan what is healthy for you. BMI is a number that explains how much body fat you have. If you have a BMI of 30 or more, you are obese. Obesity is often caused by eating or drinking morecalories than your body uses. Changing your lifestyle can help you lose weight. Obesity can cause serious health problems, such as: Stroke. Coronary artery disease (CAD). Type 2 diabetes. Some types of cancer, including cancers of the colon, breast, uterus, and gallbladder. Osteoarthritis. High blood pressure (hypertension). High cholesterol. Sleep apnea. Gallbladder stones. Infertility problems. What are the causes? Eating meals each day that are high in calories, sugar, and fat. Being born with genes that may make you more likely to become obese. Having a medical condition that causes obesity. Taking certain medicines. Sitting a lot (having a sedentary lifestyle). Not getting enough sleep. Drinking a lot of drinks that have sugar in them. What increases the risk? Having a family history of obesity. Being an African American woman. Being a Hispanic man. Living in an area with limited access to: Parks, recreation centers, or sidewalks. Healthy food choices, such as grocery stores and farmers' markets. What are the signs or symptoms? The main sign is having too much body fat. How is this treated? Treatment for this condition often includes changing your lifestyle. Treatment may include: Changing your diet. This may include making a healthy meal plan. Exercise. This may include activity that causes your heart to beat faster (aerobic exercise) and strength training. Work with your doctor to design a program that works for you. Medicine to help you lose weight. This may be used if you are not able to lose 1 pound a week after 6 weeks of healthy eating and more exercise. Treating conditions that cause the  obesity. Surgery. Options may include gastric banding and gastric bypass. This may be done if: Other treatments have not helped to improve your condition. You have a BMI of 40 or higher. You have life-threatening health problems related to obesity. Follow these instructions at home: Eating and drinking  Follow advice from your doctor about what to eat and drink. Your doctor may tell you to: Limit fast food, sweets, and processed snack foods. Choose low-fat options. For example, choose low-fat milk instead of whole milk. Eat 5 or more servings of fruits or vegetables each day. Eat at home more often. This gives you more control over what you eat. Choose healthy foods when you eat out. Learn to read food labels. This will help you learn how much food is in 1 serving. Keep low-fat snacks available. Avoid drinks that have a lot of sugar in them. These include soda, fruit juice, iced tea with sugar, and flavored milk. Drink enough water to keep your pee (urine) pale yellow. Do not go on fad diets.  Physical activity Exercise often, as told by your doctor. Most adults should get up to 150 minutes of moderate-intensity exercise every week.Ask your doctor: What types of exercise are safe for you. How often you should exercise. Warm up and stretch before being active. Do slow stretching after being active (cool down). Rest between times of being active. Lifestyle Work with your doctor and a food expert (dietitian) to set a weight-loss goal that is best for you. Limit your screen time. Find ways to reward yourself that do not involve food. Do not drink alcohol if: Your doctor tells you not to drink.   You are pregnant, may be pregnant, or are planning to become pregnant. If you drink alcohol: Limit how much you use to: 0-1 drink a day for women. 0-2 drinks a day for men. Be aware of how much alcohol is in your drink. In the [place] one drink equals one 12 oz bottle of beer (355 mL), one 5 oz  glass of wine (148 mL), or one 1 oz glass of hard liquor (44 mL). General instructions Keep a weight-loss journal. This can help you keep track of: The food that you eat. How much exercise you get. Take over-the-counter and prescription medicines only as told by your doctor. Take vitamins and supplements only as told by your doctor. Think about joining a support group. Keep all follow-up visits as told by your doctor. This is important. Contact a doctor if: You cannot meet your weight loss goal after you have changed your diet and lifestyle for 6 weeks. Get help right away if you: Are having trouble breathing. Are having thoughts of harming yourself. Summary Obesity is having too much body fat. Being obese means that your weight is more than what is healthy for you. Work with your doctor to set a weight-loss goal. Get regular exercise as told by your doctor. This information is not intended to replace advice given to you by your health care provider. Make sure you discuss any questions you have with your healthcare provider. Document Revised: 04/21/2018 Document Reviewed: 04/21/2018 Elsevier Patient Education  2022 Elsevier Inc.  

## 2021-04-08 ENCOUNTER — Other Ambulatory Visit: Payer: Self-pay | Admitting: Nurse Practitioner

## 2021-04-08 DIAGNOSIS — I10 Essential (primary) hypertension: Secondary | ICD-10-CM

## 2021-04-16 DIAGNOSIS — Z1231 Encounter for screening mammogram for malignant neoplasm of breast: Secondary | ICD-10-CM | POA: Diagnosis not present

## 2021-04-17 ENCOUNTER — Encounter: Payer: Self-pay | Admitting: Physician Assistant

## 2021-04-17 ENCOUNTER — Other Ambulatory Visit: Payer: Self-pay

## 2021-04-17 ENCOUNTER — Other Ambulatory Visit: Payer: Self-pay | Admitting: Physician Assistant

## 2021-04-17 ENCOUNTER — Ambulatory Visit (INDEPENDENT_AMBULATORY_CARE_PROVIDER_SITE_OTHER): Payer: PPO | Admitting: Physician Assistant

## 2021-04-17 DIAGNOSIS — I10 Essential (primary) hypertension: Secondary | ICD-10-CM

## 2021-04-17 DIAGNOSIS — Z6841 Body Mass Index (BMI) 40.0 and over, adult: Secondary | ICD-10-CM | POA: Diagnosis not present

## 2021-04-17 DIAGNOSIS — E7800 Pure hypercholesterolemia, unspecified: Secondary | ICD-10-CM

## 2021-04-17 MED ORDER — ATORVASTATIN CALCIUM 20 MG PO TABS: ORAL_TABLET | ORAL | 3 refills | Status: DC

## 2021-04-17 MED ORDER — TOPIRAMATE 25 MG PO TABS: 25.0000 mg | ORAL_TABLET | Freq: Every day | ORAL | 1 refills | Status: DC

## 2021-04-17 NOTE — Progress Notes (Signed)
[facility] [place], [place] [address]  Internal MEDICINE  Office Visit Note  Patient Name: Savannah Franco  [id number]  [id number]  Date of Service: 04/22/2021  Chief Complaint  Patient presents with   Follow-up   Medical Management of Chronic Issues    Weight managment   Hypertension   Hyperlipidemia    HPI Pt is here for routine follow up for wt management -Has lost 4lbs since last visit -She is eating smaller portions. She is also trying to exercise some.  -She feels much better, less stressed -BP at home 120-130/70-80s -Had mammogram yesterday which was normal -Will go get tetanus  Current Medication: Outpatient Encounter Medications as of 04/17/2021  Medication Sig   aspirin 81 MG EC tablet Take 81 mg by mouth daily. Swallow whole.   B Complex Vitamins (VITAMIN B COMPLEX PO) Take by mouth.   cholecalciferol (VITAMIN D) 1000 units tablet Take by mouth daily.   diclofenac sodium (VOLTAREN) 1 % GEL Apply 4 g topically 4 (four) times daily.   levothyroxine (SYNTHROID) 75 MCG tablet Take 1 tablet (75 mcg total) by mouth daily before breakfast.   losartan-hydrochlorothiazide (HYZAAR) 50-12.5 MG tablet Take 1.5 tablets by mouth daily.   meclizine (ANTIVERT) 12.5 MG tablet Take 1 tablet (12.5 mg total) by mouth 2 (two) times daily as needed for dizziness.   Tdap (ADACEL) 12-30-13.5 LF-MCG/0.5 injection Inject 0.5 mLs into the muscle once.   [DISCONTINUED] atorvastatin (LIPITOR) 20 MG tablet TAKE 1 TABLET(20 MG) BY MOUTH DAILY   [DISCONTINUED] topiramate (TOPAMAX) 25 MG tablet Take 1 tablet (25 mg total) by mouth 2 (two) times daily.   atorvastatin (LIPITOR) 20 MG tablet Take 1 tablet by mouth daily   topiramate (TOPAMAX) 25 MG tablet Take 1 tablet (25 mg total) by mouth daily.   No facility-administered encounter medications on file as of 04/17/2021.    Surgical History: Past Surgical History:  Procedure Laterality Date   BREAST CYST ASPIRATION  Right    NEG   BREAST EXCISIONAL BIOPSY Left 2002   NEG   CATARACT EXTRACTION W/PHACO Right 10/12/2019   Procedure: CATARACT EXTRACTION PHACO AND INTRAOCULAR LENS PLACEMENT (IOC) RIGHT VISION BLUE 10.81  00:58.8  17.9%;  Surgeon: Marchia Meiers, MD;  Location: [place];  Service: Ophthalmology;  Laterality: Right;   DENTAL SURGERY      Medical History: Past Medical History:  Diagnosis Date   Arthritis    right knee   Hyperlipidemia    Hypertension    Hypothyroidism    Vertigo    "years ago"   Wears dentures    full upper    Family History: Family History  Problem Relation Age of Onset   Multiple myeloma Mother    Stroke Father    Hypertension Father    Breast cancer Neg Hx     Social History   Socioeconomic History   Marital status: Divorced    Spouse name: Not on file   Number of children: Not on file   Years of education: Not on file   Highest education level: Not on file  Occupational History   Not on file  Tobacco Use   Smoking status: Former    Types: Cigarettes    Quit date: 2005    Years since quitting: 17.6   Smokeless tobacco: Never  Vaping Use   Vaping Use: Never used  Substance and Sexual Activity   Alcohol use: Yes    Alcohol/week: 1.0 standard drink  Types: 1 Cans of beer per week    Comment: social (1 beer/week)   Drug use: No   Sexual activity: Not on file  Other Topics Concern   Not on file  Social History Narrative   Not on file   Social Determinants of Health   Financial Resource Strain: Not on file  Food Insecurity: Not on file  Transportation Needs: Not on file  Physical Activity: Not on file  Stress: Not on file  Social Connections: Not on file  Intimate Partner Violence: Not on file      Review of Systems  Constitutional:  Negative for chills, fatigue and unexpected weight change.  HENT:  Negative for congestion, postnasal drip, rhinorrhea, sneezing and sore throat.   Eyes:  Negative for redness.   Respiratory:  Negative for cough, chest tightness and shortness of breath.   Cardiovascular:  Negative for chest pain and palpitations.  Gastrointestinal:  Negative for abdominal pain, constipation, diarrhea, nausea and vomiting.  Genitourinary:  Negative for dysuria and frequency.  Musculoskeletal:  Negative for arthralgias, back pain, joint swelling and neck pain.  Skin:  Negative for rash.  Neurological: Negative.  Negative for tremors and numbness.  Hematological:  Negative for adenopathy. Does not bruise/bleed easily.  Psychiatric/Behavioral:  Negative for behavioral problems (Depression), sleep disturbance and suicidal ideas. The patient is not nervous/anxious.    Vital Signs: BP 120/80   Pulse 72   Temp 97.8 F (36.6 C)   Resp 16   Ht 5' 1"  (1.549 m)   Wt 217 lb (98.4 kg)   SpO2 98%   BMI 41.00 kg/m    Physical Exam Vitals and nursing note reviewed.  Constitutional:      General: She is not in acute distress.    Appearance: She is well-developed. She is obese. She is not diaphoretic.  HENT:     Head: Normocephalic and atraumatic.     Mouth/Throat:     Pharynx: No oropharyngeal exudate.  Eyes:     Pupils: Pupils are equal, round, and reactive to light.  Neck:     Thyroid: No thyromegaly.     Vascular: No JVD.     Trachea: No tracheal deviation.  Cardiovascular:     Rate and Rhythm: Normal rate and regular rhythm.     Heart sounds: Normal heart sounds. No murmur heard.   No friction rub. No gallop.  Pulmonary:     Effort: Pulmonary effort is normal. No respiratory distress.     Breath sounds: No wheezing or rales.  Chest:     Chest wall: No tenderness.  Abdominal:     General: Bowel sounds are normal.     Palpations: Abdomen is soft.  Musculoskeletal:        General: Normal range of motion.     Cervical back: Normal range of motion and neck supple.  Lymphadenopathy:     Cervical: No cervical adenopathy.  Skin:    General: Skin is warm and dry.   Neurological:     Mental Status: She is alert and oriented to person, place, and time.     Cranial Nerves: No cranial nerve deficit.  Psychiatric:        Behavior: Behavior normal.        Thought Content: Thought content normal.        Judgment: Judgment normal.       Assessment/Plan: 1. Essential hypertension Stable, continue current medication and monitor at home  2. Hypercholesterolemia Continue Lipitor-refill sent today -  atorvastatin (LIPITOR) 20 MG tablet; Take 1 tablet by mouth daily  Dispense: 90 tablet; Refill: 3  3. Morbid obesity with BMI of 40.0-44.9, adult [facility]) Patient has lost 4 pounds since last visit and was applauded on her diet and exercise changes.  We will continue on Topamax to aid weight loss goals. Obesity Counseling: Had a lengthy discussion regarding patients BMI and weight issues. Patient was instructed on portion control as well as increased activity. Also discussed caloric restrictions with trying to maintain intake less than 2000 Kcal. Discussions were made in accordance with the 5As of weight management. Simple actions such as not eating late and if able to, taking a walk is suggested. - topiramate (TOPAMAX) 25 MG tablet; Take 1 tablet (25 mg total) by mouth daily.  Dispense: 30 tablet; Refill: 1   General Counseling: Zenya verbalizes understanding of the findings of todays visit and agrees with plan of treatment. I have discussed any further diagnostic evaluation that may be needed or ordered today. We also reviewed her medications today. she has been encouraged to call the office with any questions or concerns that should arise related to todays visit.    No orders of the defined types were placed in this encounter.   Meds ordered this encounter  Medications   topiramate (TOPAMAX) 25 MG tablet    Sig: Take 1 tablet (25 mg total) by mouth daily.    Dispense:  30 tablet    Refill:  1   atorvastatin (LIPITOR) 20 MG tablet    Sig: Take 1 tablet  by mouth daily    Dispense:  90 tablet    Refill:  3    This patient was seen by Drema Dallas, PA-C in collaboration with Dr. Clayborn Bigness as a part of collaborative care agreement.   Total time spent:35 Minutes Time spent includes review of chart, medications, test results, and follow up plan with the patient.      Dr Lavera Guise Internal medicine

## 2021-04-22 NOTE — Patient Instructions (Signed)
Obesity, Adult Obesity is having too much body fat. Being obese means that your weight is morethan what is healthy for you. BMI is a number that explains how much body fat you have. If you have a BMI of 30 or more, you are obese. Obesity is often caused by eating or drinking morecalories than your body uses. Changing your lifestyle can help you lose weight. Obesity can cause serious health problems, such as: Stroke. Coronary artery disease (CAD). Type 2 diabetes. Some types of cancer, including cancers of the colon, breast, uterus, and gallbladder. Osteoarthritis. High blood pressure (hypertension). High cholesterol. Sleep apnea. Gallbladder stones. Infertility problems. What are the causes? Eating meals each day that are high in calories, sugar, and fat. Being born with genes that may make you more likely to become obese. Having a medical condition that causes obesity. Taking certain medicines. Sitting a lot (having a sedentary lifestyle). Not getting enough sleep. Drinking a lot of drinks that have sugar in them. What increases the risk? Having a family history of obesity. Being an African American woman. Being a Hispanic man. Living in an area with limited access to: Parks, recreation centers, or sidewalks. Healthy food choices, such as grocery stores and farmers' markets. What are the signs or symptoms? The main sign is having too much body fat. How is this treated? Treatment for this condition often includes changing your lifestyle. Treatment may include: Changing your diet. This may include making a healthy meal plan. Exercise. This may include activity that causes your heart to beat faster (aerobic exercise) and strength training. Work with your doctor to design a program that works for you. Medicine to help you lose weight. This may be used if you are not able to lose 1 pound a week after 6 weeks of healthy eating and more exercise. Treating conditions that cause the  obesity. Surgery. Options may include gastric banding and gastric bypass. This may be done if: Other treatments have not helped to improve your condition. You have a BMI of 40 or higher. You have life-threatening health problems related to obesity. Follow these instructions at home: Eating and drinking  Follow advice from your doctor about what to eat and drink. Your doctor may tell you to: Limit fast food, sweets, and processed snack foods. Choose low-fat options. For example, choose low-fat milk instead of whole milk. Eat 5 or more servings of fruits or vegetables each day. Eat at home more often. This gives you more control over what you eat. Choose healthy foods when you eat out. Learn to read food labels. This will help you learn how much food is in 1 serving. Keep low-fat snacks available. Avoid drinks that have a lot of sugar in them. These include soda, fruit juice, iced tea with sugar, and flavored milk. Drink enough water to keep your pee (urine) pale yellow. Do not go on fad diets.  Physical activity Exercise often, as told by your doctor. Most adults should get up to 150 minutes of moderate-intensity exercise every week.Ask your doctor: What types of exercise are safe for you. How often you should exercise. Warm up and stretch before being active. Do slow stretching after being active (cool down). Rest between times of being active. Lifestyle Work with your doctor and a food expert (dietitian) to set a weight-loss goal that is best for you. Limit your screen time. Find ways to reward yourself that do not involve food. Do not drink alcohol if: Your doctor tells you not to drink.   You are pregnant, may be pregnant, or are planning to become pregnant. If you drink alcohol: Limit how much you use to: 0-1 drink a day for women. 0-2 drinks a day for men. Be aware of how much alcohol is in your drink. In the [place] one drink equals one 12 oz bottle of beer (355 mL), one 5 oz  glass of wine (148 mL), or one 1 oz glass of hard liquor (44 mL). General instructions Keep a weight-loss journal. This can help you keep track of: The food that you eat. How much exercise you get. Take over-the-counter and prescription medicines only as told by your doctor. Take vitamins and supplements only as told by your doctor. Think about joining a support group. Keep all follow-up visits as told by your doctor. This is important. Contact a doctor if: You cannot meet your weight loss goal after you have changed your diet and lifestyle for 6 weeks. Get help right away if you: Are having trouble breathing. Are having thoughts of harming yourself. Summary Obesity is having too much body fat. Being obese means that your weight is more than what is healthy for you. Work with your doctor to set a weight-loss goal. Get regular exercise as told by your doctor. This information is not intended to replace advice given to you by your health care provider. Make sure you discuss any questions you have with your healthcare provider. Document Revised: 04/21/2018 Document Reviewed: 04/21/2018 Elsevier Patient Education  2022 Elsevier Inc.  

## 2021-04-25 ENCOUNTER — Telehealth: Payer: Self-pay | Admitting: Internal Medicine

## 2021-04-25 NOTE — Chronic Care Management (AMB) (Signed)
  Chronic Care Management   Outreach Note  04/25/2021 Name: Savannah Franco MRN: [id number] DOB: [birth year]  Referred by: Lyndon Code, MD Reason for referral : No chief complaint on file.   A second unsuccessful telephone outreach was attempted today. The patient was referred to pharmacist for assistance with care management and care coordination.  Follow Up Plan:   Tatjana Dellinger Upstream Scheduler

## 2021-05-13 ENCOUNTER — Other Ambulatory Visit: Payer: Self-pay | Admitting: Physician Assistant

## 2021-06-13 ENCOUNTER — Ambulatory Visit: Payer: PPO | Admitting: Physician Assistant

## 2021-06-30 DIAGNOSIS — H2512 Age-related nuclear cataract, left eye: Secondary | ICD-10-CM | POA: Diagnosis not present

## 2021-07-07 ENCOUNTER — Encounter: Payer: Self-pay | Admitting: Physician Assistant

## 2021-07-07 ENCOUNTER — Other Ambulatory Visit: Payer: Self-pay

## 2021-07-07 ENCOUNTER — Ambulatory Visit (INDEPENDENT_AMBULATORY_CARE_PROVIDER_SITE_OTHER): Payer: PPO | Admitting: Physician Assistant

## 2021-07-07 VITALS — BP 142/84 | HR 78 | Temp 98.7°F | Resp 16 | Ht 61.5 in | Wt 221.0 lb

## 2021-07-07 DIAGNOSIS — I10 Essential (primary) hypertension: Secondary | ICD-10-CM | POA: Diagnosis not present

## 2021-07-07 DIAGNOSIS — E782 Mixed hyperlipidemia: Secondary | ICD-10-CM | POA: Diagnosis not present

## 2021-07-07 DIAGNOSIS — Z6841 Body Mass Index (BMI) 40.0 and over, adult: Secondary | ICD-10-CM | POA: Diagnosis not present

## 2021-07-07 DIAGNOSIS — E038 Other specified hypothyroidism: Secondary | ICD-10-CM

## 2021-07-07 DIAGNOSIS — E063 Autoimmune thyroiditis: Secondary | ICD-10-CM | POA: Diagnosis not present

## 2021-07-07 NOTE — Progress Notes (Signed)
[facility] [place], [place] [address]  Internal MEDICINE  Office Visit Note  Patient Name: Savannah Franco  [id number]  [id number]  Date of Service: 07/09/2021  Chief Complaint  Patient presents with   Follow-up   Hyperlipidemia   Hypertension   Weight Loss    HPI Pt is here for routine follow up for weight loss -Topiramate made her feel off and so she decreased frequency and then ultimately stopped it  -States she is not interested in trying any more medications to aid weight loss and really just wants to maintain at this point. Discussed not giving up on weight loss ogals by way of diet and exercise. -She is exercising every morning now and will try to work on improving diet and watching caloric intake as well now -She has a question about allergies and wonders whether she should restart claritin or other OTC antihistamine since her allergies have been worse the past few weeks with the weather change. Will restart this now after discussion. -she has her next cataract surgery at the end of the month -Discussed that her BP is borderline in office today and will monitor this closely and call is remaining elevated  Current Medication: Outpatient Encounter Medications as of 07/07/2021  Medication Sig   aspirin 81 MG EC tablet Take 81 mg by mouth daily. Swallow whole.   atorvastatin (LIPITOR) 20 MG tablet Take 1 tablet by mouth daily   B Complex Vitamins (VITAMIN B COMPLEX PO) Take by mouth.   cholecalciferol (VITAMIN D) 1000 units tablet Take by mouth daily.   diclofenac sodium (VOLTAREN) 1 % GEL Apply 4 g topically 4 (four) times daily.   levothyroxine (SYNTHROID) 75 MCG tablet Take 1 tablet (75 mcg total) by mouth daily before breakfast.   losartan-hydrochlorothiazide (HYZAAR) 50-12.5 MG tablet Take 1.5 tablets by mouth daily.   meclizine (ANTIVERT) 12.5 MG tablet Take 1 tablet (12.5 mg total) by mouth 2 (two) times daily as needed for dizziness.   Tdap  (ADACEL) 12-30-13.5 LF-MCG/0.5 injection Inject 0.5 mLs into the muscle once.   [DISCONTINUED] topiramate (TOPAMAX) 25 MG tablet TAKE 1 TABLET(25 MG) BY MOUTH DAILY (Patient not taking: Reported on 07/07/2021)   No facility-administered encounter medications on file as of 07/07/2021.    Surgical History: Past Surgical History:  Procedure Laterality Date   BREAST CYST ASPIRATION Right    NEG   BREAST EXCISIONAL BIOPSY Left 2002   NEG   CATARACT EXTRACTION W/PHACO Right 10/12/2019   Procedure: CATARACT EXTRACTION PHACO AND INTRAOCULAR LENS PLACEMENT (IOC) RIGHT VISION BLUE 10.81  00:58.8  17.9%;  Surgeon: Marchia Meiers, MD;  Location: [place];  Service: Ophthalmology;  Laterality: Right;   DENTAL SURGERY      Medical History: Past Medical History:  Diagnosis Date   Arthritis    right knee   Hyperlipidemia    Hypertension    Hypothyroidism    Vertigo    "years ago"   Wears dentures    full upper    Family History: Family History  Problem Relation Age of Onset   Multiple myeloma Mother    Stroke Father    Hypertension Father    Breast cancer Neg Hx     Social History   Socioeconomic History   Marital status: Divorced    Spouse name: Not on file   Number of children: Not on file   Years of education: Not on file   Highest education level: Not on file  Occupational History   Not on file  Tobacco Use   Smoking status: Former    Types: Cigarettes    Quit date: 2005    Years since quitting: 17.8   Smokeless tobacco: Never  Vaping Use   Vaping Use: Never used  Substance and Sexual Activity   Alcohol use: Yes    Alcohol/week: 1.0 standard drink    Types: 1 Cans of beer per week    Comment: social (1 beer/week)   Drug use: No   Sexual activity: Not on file  Other Topics Concern   Not on file  Social History Narrative   Not on file   Social Determinants of Health   Financial Resource Strain: Not on file  Food Insecurity: Not on file  Transportation  Needs: Not on file  Physical Activity: Not on file  Stress: Not on file  Social Connections: Not on file  Intimate Partner Violence: Not on file      Review of Systems  Constitutional:  Negative for chills, fatigue and unexpected weight change.  HENT:  Negative for congestion, postnasal drip, rhinorrhea, sneezing and sore throat.   Eyes:  Negative for redness.  Respiratory:  Negative for cough, chest tightness, shortness of breath and wheezing.   Cardiovascular:  Negative for chest pain and palpitations.  Gastrointestinal:  Negative for abdominal pain, constipation, diarrhea, nausea and vomiting.  Genitourinary:  Negative for dysuria and frequency.  Musculoskeletal:  Negative for arthralgias, back pain, joint swelling and neck pain.  Skin:  Negative for rash.  Neurological: Negative.  Negative for tremors and numbness.  Hematological:  Negative for adenopathy. Does not bruise/bleed easily.  Psychiatric/Behavioral:  Negative for behavioral problems (Depression), sleep disturbance and suicidal ideas. The patient is not nervous/anxious.    Vital Signs: BP (!) 142/84   Pulse 78   Temp 98.7 F (37.1 C)   Resp 16   Ht 5' 1.5" (1.562 m)   Wt 221 lb (100.2 kg)   SpO2 97%   BMI 41.08 kg/m    Physical Exam Vitals and nursing note reviewed.  Constitutional:      General: She is not in acute distress.    Appearance: She is well-developed. She is obese. She is not diaphoretic.  HENT:     Head: Normocephalic and atraumatic.     Mouth/Throat:     Pharynx: No oropharyngeal exudate.  Eyes:     Pupils: Pupils are equal, round, and reactive to light.  Neck:     Thyroid: No thyromegaly.     Vascular: No JVD.     Trachea: No tracheal deviation.  Cardiovascular:     Rate and Rhythm: Normal rate and regular rhythm.     Heart sounds: Normal heart sounds. No murmur heard.   No friction rub. No gallop.  Pulmonary:     Effort: Pulmonary effort is normal. No respiratory distress.      Breath sounds: No wheezing or rales.  Chest:     Chest wall: No tenderness.  Abdominal:     General: Bowel sounds are normal.     Palpations: Abdomen is soft.  Musculoskeletal:        General: Normal range of motion.     Cervical back: Normal range of motion and neck supple.  Lymphadenopathy:     Cervical: No cervical adenopathy.  Skin:    General: Skin is warm and dry.  Neurological:     Mental Status: She is alert and oriented to person, place, and time.  Cranial Nerves: No cranial nerve deficit.  Psychiatric:        Behavior: Behavior normal.        Thought Content: Thought content normal.        Judgment: Judgment normal.       Assessment/Plan: 1. Essential hypertension Borderline elevated in office today, pt will monitor closely and call if continued elevation, Continue current medications for now  2. Hypothyroidism due to Hashimoto's thyroiditis Stable, continue synthroid  3. Mixed hyperlipidemia Continue lipitor  4. Morbid obesity with BMI of 40.0-44.9, adult ([place]) Obesity Counseling: Had a lengthy discussion regarding patients BMI and weight issues. Patient was instructed on portion control as well as increased activity. Also discussed caloric restrictions with trying to maintain intake less than 2000 Kcal. Discussions were made in accordance with the 5As of weight management. Simple actions such as not eating late and if able to, taking a walk is suggested.    General Counseling: marylin lathon understanding of the findings of todays visit and agrees with plan of treatment. I have discussed any further diagnostic evaluation that may be needed or ordered today. We also reviewed her medications today. she has been encouraged to call the office with any questions or concerns that should arise related to todays visit.    No orders of the defined types were placed in this encounter.   No orders of the defined types were placed in this encounter.   This  patient was seen by Drema Dallas, PA-C in collaboration with Dr. Clayborn Bigness as a part of collaborative care agreement.   Total time spent:30 Minutes Time spent includes review of chart, medications, test results, and follow up plan with the patient.      Dr Lavera Guise Internal medicine

## 2021-07-10 DIAGNOSIS — H2512 Age-related nuclear cataract, left eye: Secondary | ICD-10-CM | POA: Diagnosis not present

## 2021-07-16 ENCOUNTER — Encounter: Payer: Self-pay | Admitting: Ophthalmology

## 2021-07-22 NOTE — Discharge Instructions (Signed)

## 2021-07-29 ENCOUNTER — Ambulatory Visit: Payer: PPO | Admitting: Anesthesiology

## 2021-07-29 ENCOUNTER — Other Ambulatory Visit: Payer: Self-pay

## 2021-07-29 ENCOUNTER — Encounter
Admission: RE | Disposition: A | Discharge status: Home / Self Care | Payer: Self-pay | Source: Home / Self Care | Attending: Ophthalmology

## 2021-07-29 ENCOUNTER — Encounter: Payer: Self-pay | Admitting: Ophthalmology

## 2021-07-29 ENCOUNTER — Ambulatory Visit
Admission: RE | Admit: 2021-07-29 | Discharge: 2021-07-29 | Disposition: A | Discharge status: Home / Self Care | Payer: PPO | Attending: Ophthalmology | Admitting: Ophthalmology

## 2021-07-29 DIAGNOSIS — Z87891 Personal history of nicotine dependence: Secondary | ICD-10-CM | POA: Diagnosis not present

## 2021-07-29 DIAGNOSIS — H2512 Age-related nuclear cataract, left eye: Secondary | ICD-10-CM | POA: Diagnosis not present

## 2021-07-29 DIAGNOSIS — E039 Hypothyroidism, unspecified: Secondary | ICD-10-CM | POA: Insufficient documentation

## 2021-07-29 DIAGNOSIS — H25812 Combined forms of age-related cataract, left eye: Secondary | ICD-10-CM | POA: Diagnosis not present

## 2021-07-29 DIAGNOSIS — I10 Essential (primary) hypertension: Secondary | ICD-10-CM | POA: Diagnosis not present

## 2021-07-29 HISTORY — PX: CATARACT EXTRACTION W/PHACO: SHX586

## 2021-07-29 SURGERY — PHACOEMULSIFICATION, CATARACT, WITH IOL INSERTION
Anesthesia: Monitor Anesthesia Care | Site: Eye | Laterality: Left

## 2021-07-29 MED ORDER — SIGHTPATH DOSE#1 BSS IO SOLN
INTRAOCULAR | Status: DC | PRN
  Administered 2021-07-29: 1 mL via INTRAMUSCULAR

## 2021-07-29 MED ORDER — MOXIFLOXACIN HCL 0.5 % OP SOLN
OPHTHALMIC | Status: DC | PRN
  Administered 2021-07-29: 0.2 mL via OPHTHALMIC

## 2021-07-29 MED ORDER — BRIMONIDINE TARTRATE-TIMOLOL 0.2-0.5 % OP SOLN
OPHTHALMIC | Status: DC | PRN
  Administered 2021-07-29: 1 [drp] via OPHTHALMIC

## 2021-07-29 MED ORDER — ARMC OPHTHALMIC DILATING DROPS
1.0000 "application " | OPHTHALMIC | Status: AC | PRN
  Administered 2021-07-29 (×3): 1 via OPHTHALMIC

## 2021-07-29 MED ORDER — FENTANYL CITRATE (PF) 100 MCG/2ML IJ SOLN
INTRAMUSCULAR | Status: DC | PRN
  Administered 2021-07-29: 50 ug via INTRAVENOUS

## 2021-07-29 MED ORDER — SIGHTPATH DOSE#1 BSS IO SOLN
INTRAOCULAR | Status: DC | PRN
  Administered 2021-07-29: 56 mL via OPHTHALMIC

## 2021-07-29 MED ORDER — MIDAZOLAM HCL 2 MG/2ML IJ SOLN
INTRAMUSCULAR | Status: DC | PRN
  Administered 2021-07-29: 1 mg via INTRAVENOUS

## 2021-07-29 MED ORDER — TETRACAINE HCL 0.5 % OP SOLN
1.0000 [drp] | OPHTHALMIC | Status: AC | PRN
  Administered 2021-07-29 (×3): 1 [drp] via OPHTHALMIC

## 2021-07-29 MED ORDER — SIGHTPATH DOSE#1 NA CHONDROIT SULF-NA HYALURON 40-17 MG/ML IO SOLN
INTRAOCULAR | Status: DC | PRN
  Administered 2021-07-29: 1 mL via INTRAOCULAR

## 2021-07-29 MED ORDER — LACTATED RINGERS IV SOLN: INTRAVENOUS | Status: DC

## 2021-07-29 MED ORDER — SIGHTPATH DOSE#1 BSS IO SOLN
INTRAOCULAR | Status: DC | PRN
  Administered 2021-07-29: 15 mL

## 2021-07-29 SURGICAL SUPPLY — 14 items
CANNULA ANT/CHMB 27GA (MISCELLANEOUS) ×2 IMPLANT
GLOVE SURG ENC TEXT LTX SZ8 (GLOVE) ×2 IMPLANT
GLOVE SURG TRIUMPH 8.0 PF LTX (GLOVE) ×2 IMPLANT
GOWN STRL REUS W/ TWL LRG LVL3 (GOWN DISPOSABLE) ×2 IMPLANT
GOWN STRL REUS W/TWL LRG LVL3 (GOWN DISPOSABLE) ×4
LENS IOL TECNIS EYHANCE 22.0 (Intraocular Lens) ×2 IMPLANT
MARKER SKIN DUAL TIP RULER LAB (MISCELLANEOUS) ×2 IMPLANT
NEEDLE FILTER BLUNT 18X 1/2SAF (NEEDLE) ×1
NEEDLE FILTER BLUNT 18X1 1/2 (NEEDLE) ×1 IMPLANT
PACK EYE AFTER SURG (MISCELLANEOUS) ×2 IMPLANT
SYR 3ML LL SCALE MARK (SYRINGE) ×4 IMPLANT
SYR TB 1ML LUER SLIP (SYRINGE) ×2 IMPLANT
WATER STERILE IRR 250ML POUR (IV SOLUTION) ×2 IMPLANT
WIPE NON LINTING 3.25X3.25 (MISCELLANEOUS) ×2 IMPLANT

## 2021-07-29 NOTE — Transfer of Care (Signed)
Immediate Anesthesia Transfer of Care Note  Patient: Savannah Franco  Procedure(s) Performed: CATARACT EXTRACTION PHACO AND INTRAOCULAR LENS PLACEMENT (IOC) LEFT 8.19 00:49.5 (Left: Eye)  Patient Location: PACU  Anesthesia Type: General  Level of Consciousness: awake, alert  and patient cooperative  Airway and Oxygen Therapy: Patient Spontanous Breathing and Patient connected to supplemental oxygen  Post-op Assessment: Post-op Vital signs reviewed, Patient's Cardiovascular Status Stable, Respiratory Function Stable, Patent Airway and No signs of Nausea or vomiting  Post-op Vital Signs: Reviewed and stable  Complications: No notable events documented.

## 2021-07-29 NOTE — Op Note (Signed)
PREOPERATIVE DIAGNOSIS:  Nuclear sclerotic cataract of the left eye.   POSTOPERATIVE DIAGNOSIS:  Nuclear sclerotic cataract of the left eye.   OPERATIVE PROCEDURE:ORPROCALL@   SURGEON:  Galen Manila, MD.   ANESTHESIA:  Anesthesiologist: Loni Beckwith, MD CRNA: Maree Krabbe, CRNA  1.      Managed anesthesia care. 2.     0.25ml of Shugarcaine was instilled following the paracentesis   COMPLICATIONS:  None.   TECHNIQUE:   Stop and chop   DESCRIPTION OF PROCEDURE:  The patient was examined and consented in the preoperative holding area where the aforementioned topical anesthesia was applied to the left eye and then brought back to the Operating Room where the left eye was prepped and draped in the usual sterile ophthalmic fashion and a lid speculum was placed. A paracentesis was created with the side port blade and the anterior chamber was filled with viscoelastic. A near clear corneal incision was performed with the steel keratome. A continuous curvilinear capsulorrhexis was performed with a cystotome followed by the capsulorrhexis forceps. Hydrodissection and hydrodelineation were carried out with BSS on a blunt cannula. The lens was removed in a stop and chop  technique and the remaining cortical material was removed with the irrigation-aspiration handpiece. The capsular bag was inflated with viscoelastic and the Technis [id number] lens was placed in the capsular bag without complication. The remaining viscoelastic was removed from the eye with the irrigation-aspiration handpiece. The wounds were hydrated. The anterior chamber was flushed with BSS and the eye was inflated to physiologic pressure. 0.81ml Vigamox was placed in the anterior chamber. The wounds were found to be water tight. The eye was dressed with Combigan. The patient was given protective glasses to wear throughout the day and a shield with which to sleep tonight. The patient was also given drops with which to begin a drop regimen  today and will follow-up with me in one day. Implant Name Type Inv. Item Serial No. Manufacturer Lot No. LRB No. Used Action  LENS IOL TECNIS EYHANCE 22.0 - [id number] Intraocular Lens LENS IOL TECNIS EYHANCE 22.0 [id number] JOHNSON   Left 1 Implanted    Procedure(s): CATARACT EXTRACTION PHACO AND INTRAOCULAR LENS PLACEMENT (IOC) LEFT 8.19 00:49.5 (Left)  Electronically signed: Galen Manila 07/29/2021 12:01 PM

## 2021-07-29 NOTE — Anesthesia Postprocedure Evaluation (Signed)
Anesthesia Post Note  Patient: Savannah Franco  Procedure(s) Performed: CATARACT EXTRACTION PHACO AND INTRAOCULAR LENS PLACEMENT (IOC) LEFT 8.19 00:49.5 (Left: Eye)     Patient location during evaluation: PACU Anesthesia Type: MAC Level of consciousness: awake and alert Pain management: pain level controlled Vital Signs Assessment: post-procedure vital signs reviewed and stable Respiratory status: spontaneous breathing, nonlabored ventilation and respiratory function stable Cardiovascular status: stable and blood pressure returned to baseline Postop Assessment: no apparent nausea or vomiting Anesthetic complications: no   No notable events documented.  April Manson

## 2021-07-29 NOTE — Anesthesia Postprocedure Evaluation (Signed)
Anesthesia Post Note  Patient: Savannah Franco  Procedure(s) Performed: CATARACT EXTRACTION PHACO AND INTRAOCULAR LENS PLACEMENT (IOC) LEFT 8.19 00:49.5 (Left: Eye)     Patient location during evaluation: PACU Anesthesia Type: MAC Level of consciousness: awake and alert Pain management: pain level controlled Vital Signs Assessment: post-procedure vital signs reviewed and stable Respiratory status: spontaneous breathing, nonlabored ventilation and respiratory function stable Cardiovascular status: stable and blood pressure returned to baseline Postop Assessment: no apparent nausea or vomiting Anesthetic complications: no   No notable events documented.  [name] P Arriyanna Mersch      

## 2021-07-29 NOTE — Anesthesia Procedure Notes (Signed)
Procedure Name: MAC Date/Time: 07/29/2021 11:43 AM Performed by: Cameron Ali, CRNA Pre-anesthesia Checklist: Patient identified, Emergency Drugs available, Suction available, Timeout performed and Patient being monitored Patient Re-evaluated:Patient Re-evaluated prior to induction Oxygen Delivery Method: Nasal cannula Placement Confirmation: positive ETCO2

## 2021-07-29 NOTE — H&P (Signed)
[place]   Primary Care Physician:  Lavera Guise, MD Ophthalmologist: Dr.Tinsley Lomas  Pre-Procedure History & Physical: HPI:  Savannah Franco is a 70 y.o. female here for cataract surgery.   Past Medical History:  Diagnosis Date   Arthritis    right knee   Hyperlipidemia    Hypertension    Hypothyroidism    Vertigo    "years ago"   Wears dentures    full upper and lower    Past Surgical History:  Procedure Laterality Date   BREAST CYST ASPIRATION Right    NEG   BREAST EXCISIONAL BIOPSY Left 2002   NEG   CATARACT EXTRACTION W/PHACO Right 10/12/2019   Procedure: CATARACT EXTRACTION PHACO AND INTRAOCULAR LENS PLACEMENT (IOC) RIGHT VISION BLUE 10.81  00:58.8  17.9%;  Surgeon: Marchia Meiers, MD;  Location: [facility];  Service: Ophthalmology;  Laterality: Right;   DENTAL SURGERY      Prior to Admission medications   Medication Sig Start Date End Date Taking? Authorizing Provider  aspirin 81 MG EC tablet Take 81 mg by mouth daily. Swallow whole.   Yes [provider]  atorvastatin (LIPITOR) 20 MG tablet Take 1 tablet by mouth daily 04/17/21  Yes McDonough, Lauren K, PA-C  B Complex Vitamins (VITAMIN B COMPLEX PO) Take by mouth.   Yes [provider]  cholecalciferol (VITAMIN D) 1000 units tablet Take by mouth daily.   Yes [provider]  levothyroxine (SYNTHROID) 75 MCG tablet Take 1 tablet (75 mcg total) by mouth daily before breakfast. 03/13/21  Yes McDonough, Lauren K, PA-C  losartan-hydrochlorothiazide (HYZAAR) 50-12.5 MG tablet Take 1.5 tablets by mouth daily. 07/18/20  Yes Lavera Guise, MD  meclizine (ANTIVERT) 12.5 MG tablet Take 1 tablet (12.5 mg total) by mouth 2 (two) times daily as needed for dizziness. 11/09/19  Yes Boscia, Heather E, NP  diclofenac sodium (VOLTAREN) 1 % GEL Apply 4 g topically 4 (four) times daily. 10/14/18   Ronnell Freshwater, NP  Tdap (ADACEL) 12-30-13.5 LF-MCG/0.5 injection Inject 0.5 mLs into the muscle  once.    [provider]    Allergies as of 07/02/2021 - Review Complete 04/17/2021  Allergen Reaction Noted   Bupropion Itching 12/12/2020    Family History  Problem Relation Age of Onset   Multiple myeloma Mother    Stroke Father    Hypertension Father    Breast cancer Neg Hx     Social History   Socioeconomic History   Marital status: Divorced    Spouse name: Not on file   Number of children: Not on file   Years of education: Not on file   Highest education level: Not on file  Occupational History   Not on file  Tobacco Use   Smoking status: Former    Types: Cigarettes    Quit date: 2005    Years since quitting: 17.9   Smokeless tobacco: Never  Vaping Use   Vaping Use: Never used  Substance and Sexual Activity   Alcohol use: Yes    Alcohol/week: 1.0 standard drink    Types: 1 Cans of beer per week    Comment: social (1 beer/week)   Drug use: No   Sexual activity: Not on file  Other Topics Concern   Not on file  Social History Narrative   Not on file   Social Determinants of Health   Financial Resource Strain: Not on file  Food Insecurity: Not on file  Transportation Needs: Not  on file  Physical Activity: Not on file  Stress: Not on file  Social Connections: Not on file  Intimate Partner Violence: Not on file    Review of Systems: See HPI, otherwise negative ROS  Physical Exam: BP (!) 167/72   Pulse 84   Temp 98.4 F (36.9 C) (Temporal)   Resp 20   Ht 5' 1" (1.549 m)   Wt 100.7 kg   SpO2 96%   BMI 41.95 kg/m  General:   Alert, cooperative in NAD Head:  Normocephalic and atraumatic. Respiratory:  Normal work of breathing. Cardiovascular:  RRR  Impression/Plan: Savannah Franco is here for cataract surgery.  Risks, benefits, limitations, and alternatives regarding cataract surgery have been reviewed with the patient.  Questions have been answered.  All parties agreeable.   Birder Robson, MD  07/29/2021, 11:36 AM

## 2021-07-29 NOTE — Anesthesia Preprocedure Evaluation (Addendum)
Anesthesia Evaluation  Patient identified by MRN, date of birth, ID band Patient awake    Reviewed: Allergy & Precautions, H&P , NPO status , Patient's Chart, lab work & pertinent test results, reviewed documented beta blocker date and time   Airway Mallampati: III  TM Distance: >3 FB Neck ROM: full    Dental  (+) Upper Dentures   Pulmonary neg pulmonary ROS, former smoker,    Pulmonary exam normal breath sounds clear to auscultation       Cardiovascular Exercise Tolerance: Good hypertension, Normal cardiovascular exam Rhythm:regular Rate:Normal     Neuro/Psych negative neurological ROS  negative psych ROS   GI/Hepatic negative GI ROS, Neg liver ROS,   Endo/Other  Hypothyroidism   Renal/GU negative Renal ROS  negative genitourinary   Musculoskeletal   Abdominal   Peds  Hematology negative hematology ROS (+)   Anesthesia Other Findings BMI > 40  Reproductive/Obstetrics negative OB ROS                            Anesthesia Physical  Anesthesia Plan  ASA: 3  Anesthesia Plan: MAC   Post-op Pain Management:    Induction:   PONV Risk Score and Plan: 2 and TIVA  Airway Management Planned:   Additional Equipment:   Intra-op Plan:   Post-operative Plan:   Informed Consent: I have reviewed the patients History and Physical, chart, labs and discussed the procedure including the risks, benefits and alternatives for the proposed anesthesia with the patient or authorized representative who has indicated his/her understanding and acceptance.     Dental Advisory Given  Plan Discussed with: CRNA  Anesthesia Plan Comments:        Anesthesia Quick Evaluation

## 2021-07-30 ENCOUNTER — Encounter: Payer: Self-pay | Admitting: Ophthalmology

## 2021-09-05 ENCOUNTER — Other Ambulatory Visit: Payer: Self-pay | Admitting: Physician Assistant

## 2021-09-05 DIAGNOSIS — E038 Other specified hypothyroidism: Secondary | ICD-10-CM

## 2021-09-05 DIAGNOSIS — E063 Autoimmune thyroiditis: Secondary | ICD-10-CM

## 2021-09-09 ENCOUNTER — Other Ambulatory Visit: Payer: Self-pay | Admitting: Physician Assistant

## 2021-09-09 DIAGNOSIS — I10 Essential (primary) hypertension: Secondary | ICD-10-CM

## 2021-10-06 ENCOUNTER — Ambulatory Visit: Payer: PPO | Admitting: Physician Assistant

## 2021-12-15 ENCOUNTER — Encounter: Payer: Self-pay | Admitting: Physician Assistant

## 2021-12-15 ENCOUNTER — Ambulatory Visit (INDEPENDENT_AMBULATORY_CARE_PROVIDER_SITE_OTHER): Payer: PPO | Admitting: Physician Assistant

## 2021-12-15 DIAGNOSIS — Z6841 Body Mass Index (BMI) 40.0 and over, adult: Secondary | ICD-10-CM

## 2021-12-15 DIAGNOSIS — R300 Dysuria: Secondary | ICD-10-CM | POA: Diagnosis not present

## 2021-12-15 DIAGNOSIS — E559 Vitamin D deficiency, unspecified: Secondary | ICD-10-CM | POA: Diagnosis not present

## 2021-12-15 DIAGNOSIS — E782 Mixed hyperlipidemia: Secondary | ICD-10-CM | POA: Diagnosis not present

## 2021-12-15 DIAGNOSIS — E063 Autoimmune thyroiditis: Secondary | ICD-10-CM | POA: Diagnosis not present

## 2021-12-15 DIAGNOSIS — R5383 Other fatigue: Secondary | ICD-10-CM | POA: Diagnosis not present

## 2021-12-15 DIAGNOSIS — E038 Other specified hypothyroidism: Secondary | ICD-10-CM

## 2021-12-15 DIAGNOSIS — Z01419 Encounter for gynecological examination (general) (routine) without abnormal findings: Secondary | ICD-10-CM | POA: Diagnosis not present

## 2021-12-15 DIAGNOSIS — E538 Deficiency of other specified B group vitamins: Secondary | ICD-10-CM | POA: Diagnosis not present

## 2021-12-15 DIAGNOSIS — I10 Essential (primary) hypertension: Secondary | ICD-10-CM

## 2021-12-15 DIAGNOSIS — Z0001 Encounter for general adult medical examination with abnormal findings: Secondary | ICD-10-CM | POA: Diagnosis not present

## 2021-12-15 NOTE — Progress Notes (Signed)
?[facility] ?[address] ?[place], [place] [address] ? ?Internal MEDICINE  ?Office Visit Note ? ?Patient Name: Savannah Franco ? [id number]  ?[id number] ? ?Date of Service: 12/17/2021 ? ?Chief Complaint  ?Patient presents with  ? Medicare Wellness  ? Hyperlipidemia  ? Hypertension  ? Quality Metric Gaps  ?  Shingles and Pneumonia - Shingles was expensive, is there a more cost effective route?  ? ? ? ?HPI ?Pt is here for routine health maintenance examination and is doing well ?-Cataract surgery went well ?-BP monitor at home stopped working so got another one and is unsure if it is working well because it seems to read high ?-her new cuff in office: 170/114, HR 81, while manual reading in office 152/78. Will get new cuff and increase and 2 tabs of hyzaar ?-Has been busy and keeping active with horses and reports she is dealing with a water problem at the stables recently ?-Due for routine fasting labs ?-tetanus and shingles vaccinations were very expensive at pharmacy and will check health dept ?-due for mammogram in August, due for colonoscopy next year ? ?Current Medication: ?Outpatient Encounter Medications as of 12/15/2021  ?Medication Sig  ? aspirin 81 MG EC tablet Take 81 mg by mouth daily. Swallow whole.  ? atorvastatin (LIPITOR) 20 MG tablet Take 1 tablet by mouth daily  ? B Complex Vitamins (VITAMIN B COMPLEX PO) Take by mouth.  ? cholecalciferol (VITAMIN D) 1000 units tablet Take by mouth daily.  ? diclofenac sodium (VOLTAREN) 1 % GEL Apply 4 g topically 4 (four) times daily.  ? levothyroxine (SYNTHROID) 75 MCG tablet TAKE 1 TABLET BY MOUTH DAILY BEFORE BREAKFAST  ? losartan-hydrochlorothiazide (HYZAAR) 50-12.5 MG tablet TAKE 1 AND 1/2 TABLETS BY MOUTH DAILY  ? meclizine (ANTIVERT) 12.5 MG tablet Take 1 tablet (12.5 mg total) by mouth 2 (two) times daily as needed for dizziness.  ? Tdap (ADACEL) 12-30-13.5 LF-MCG/0.5 injection Inject 0.5 mLs into the muscle once.  ? ?No facility-administered  encounter medications on file as of 12/15/2021.  ? ? ?Surgical History: ?Past Surgical History:  ?Procedure Laterality Date  ? BREAST CYST ASPIRATION Right   ? NEG  ? BREAST EXCISIONAL BIOPSY Left 2002  ? NEG  ? CATARACT EXTRACTION W/PHACO Right 10/12/2019  ? Procedure: CATARACT EXTRACTION PHACO AND INTRAOCULAR LENS PLACEMENT (IOC) RIGHT VISION BLUE 10.81  00:58.8  17.9%;  Surgeon: Marchia Meiers, MD;  Location: [facility];  Service: Ophthalmology;  Laterality: Right;  ? CATARACT EXTRACTION W/PHACO Left 07/29/2021  ? Procedure: CATARACT EXTRACTION PHACO AND INTRAOCULAR LENS PLACEMENT (IOC) LEFT 8.19 00:49.5;  Surgeon: Birder Robson, MD;  Location: [facility];  Service: Ophthalmology;  Laterality: Left;  ? DENTAL SURGERY    ? ? ?Medical History: ?Past Medical History:  ?Diagnosis Date  ? Arthritis   ? right knee  ? Hyperlipidemia   ? Hypertension   ? Hypothyroidism   ? Vertigo   ? "years ago"  ? Wears dentures   ? full upper and lower  ? ? ?Family History: ?Family History  ?Problem Relation Age of Onset  ? Multiple myeloma Mother   ? Stroke Father   ? Hypertension Father   ? Breast cancer Neg Hx   ? ? ? ? ?Review of Systems  ?Constitutional:  Negative for chills, fatigue and unexpected weight change.  ?HENT:  Negative for congestion, postnasal drip, rhinorrhea, sneezing and sore throat.   ?Eyes:  Negative for redness.  ?Respiratory:  Negative for cough, chest tightness,  shortness of breath and wheezing.   ?Cardiovascular:  Negative for chest pain and palpitations.  ?Gastrointestinal:  Negative for abdominal pain, constipation, diarrhea, nausea and vomiting.  ?Genitourinary:  Negative for dysuria and frequency.  ?Musculoskeletal:  Negative for arthralgias, back pain, joint swelling and neck pain.  ?Skin:  Negative for rash.  ?Neurological: Negative.  Negative for tremors and numbness.  ?Hematological:  Negative for adenopathy. Does not bruise/bleed easily.  ?Psychiatric/Behavioral:  Negative for  behavioral problems (Depression), sleep disturbance and suicidal ideas. The patient is not nervous/anxious.   ? ? ?Vital Signs: ?BP (!) 148/74   Pulse 81   Temp 97.7 ?F (36.5 ?C)   Resp 16   Ht 5' 1.5" (1.562 m)   Wt 225 lb 3.2 oz (102.2 kg)   SpO2 94%   BMI 41.86 kg/m?  ? ? ?Physical Exam ?Vitals and nursing note reviewed.  ?Constitutional:   ?   General: She is not in acute distress. ?   Appearance: She is well-developed. She is obese. She is not diaphoretic.  ?HENT:  ?   Head: Normocephalic and atraumatic.  ?   Mouth/Throat:  ?   Pharynx: No oropharyngeal exudate.  ?Eyes:  ?   Pupils: Pupils are equal, round, and reactive to light.  ?Neck:  ?   Thyroid: No thyromegaly.  ?   Vascular: No JVD.  ?   Trachea: No tracheal deviation.  ?Cardiovascular:  ?   Rate and Rhythm: Normal rate and regular rhythm.  ?   Heart sounds: Normal heart sounds. No murmur heard. ?  No friction rub. No gallop.  ?Pulmonary:  ?   Effort: Pulmonary effort is normal. No respiratory distress.  ?   Breath sounds: No wheezing or rales.  ?Chest:  ?   Chest wall: No tenderness.  ?Breasts: ?   Right: Normal. No mass.  ?   Left: Normal. No mass.  ?Abdominal:  ?   General: Bowel sounds are normal.  ?   Palpations: Abdomen is soft.  ?   Tenderness: There is no abdominal tenderness.  ?Musculoskeletal:     ?   General: Normal range of motion.  ?   Cervical back: Normal range of motion and neck supple.  ?Lymphadenopathy:  ?   Cervical: No cervical adenopathy.  ?Skin: ?   General: Skin is warm and dry.  ?Neurological:  ?   Mental Status: She is alert and oriented to person, place, and time.  ?   Cranial Nerves: No cranial nerve deficit.  ?Psychiatric:     ?   Behavior: Behavior normal.     ?   Thought Content: Thought content normal.     ?   Judgment: Judgment normal.  ? ? ? ?LABS: ?Recent Results (from the past 2160 hour(s))  ?UA/M w/rflx Culture, Routine     Status: None  ? Collection Time: 12/15/21  3:45 PM  ? Specimen: Urine  ? Urine  ?Result  Value Ref Range  ? Specific Gravity, UA 1.016 1.005 - 1.030  ? pH, UA 5.0 5.0 - 7.5  ? Color, UA Yellow Yellow  ? Appearance Ur Clear Clear  ? Leukocytes,UA Negative Negative  ? Protein,UA Negative Negative/Trace  ? Glucose, UA Negative Negative  ? Ketones, UA Negative Negative  ? RBC, UA Negative Negative  ? Bilirubin, UA Negative Negative  ? Urobilinogen, Ur 0.2 0.2 - 1.0 mg/dL  ? Nitrite, UA Negative Negative  ? Microscopic Examination Comment   ?  Comment: Microscopic follows if indicated.  ?  Microscopic Examination See below:   ?  Comment: Microscopic was indicated and was performed.  ? Urinalysis Reflex Comment   ?  Comment: This specimen will not reflex to a Urine Culture.  ?Microscopic Examination     Status: None  ? Collection Time: 12/15/21  3:45 PM  ? Urine  ?Result Value Ref Range  ? WBC, UA 0-5 0 - 5 /hpf  ? RBC None seen 0 - 2 /hpf  ? Epithelial Cells (non renal) 0-10 0 - 10 /hpf  ? Casts None seen None seen /lpf  ? Bacteria, UA Few None seen/Few  ? ? ? ? ? ? ?Assessment/Plan: ?1. Encounter for general adult medical examination with abnormal findings ?CPE performed, routine fasting labs ordered, due for mammogram in August ? ?2. Essential hypertension ?Elevated in office and possibly at home, though needs new cuff as hers seems very inaccurate. Will go ahead and increase to 2 tabs of hyzaar and monitor closely ? ?3. Mixed hyperlipidemia ?Continue lipitor and will update labs ?- Lipid Panel With LDL/HDL Ratio ? ?4. Hypothyroidism due to Hashimoto's thyroiditis ?Will update labs and adjust synthroid as indicated ?- TSH + free T4 ? ?5. Visit for gynecologic examination ?Breast exam performed ? ?6. B12 deficiency ?- B12 and Folate Panel ? ?7. Vitamin D deficiency ?- VITAMIN D 25 Hydroxy (Vit-D Deficiency, Fractures) ? ?8. Other fatigue ?- CBC w/Diff/Platelet ?- Comprehensive metabolic panel ?- Lipid Panel With LDL/HDL Ratio ?- VITAMIN D 25 Hydroxy (Vit-D Deficiency, Fractures) ?- B12 and Folate Panel ?-  TSH + free T4 ? ?9. Morbid obesity with BMI of 40.0-44.9, adult ([place]) ?Continue to work on diet and exercise ? ?10. Dysuria ?- UA/M w/rflx Culture, Routine ? ? ?General Counseling: raylin diguglielmo understanding of

## 2021-12-16 LAB — MICROSCOPIC EXAMINATION
Casts: NONE SEEN /lpf
RBC, Urine: NONE SEEN /hpf (ref 0–2)

## 2021-12-16 LAB — UA/M W/RFLX CULTURE, ROUTINE
Bilirubin, UA: NEGATIVE
Glucose, UA: NEGATIVE
Ketones, UA: NEGATIVE
Leukocytes,UA: NEGATIVE
Nitrite, UA: NEGATIVE
Protein,UA: NEGATIVE
RBC, UA: NEGATIVE
Specific Gravity, UA: 1.016 (ref 1.005–1.030)
Urobilinogen, Ur: 0.2 mg/dL (ref 0.2–1.0)
pH, UA: 5 (ref 5.0–7.5)

## 2022-01-01 DIAGNOSIS — E038 Other specified hypothyroidism: Secondary | ICD-10-CM | POA: Diagnosis not present

## 2022-01-01 DIAGNOSIS — E538 Deficiency of other specified B group vitamins: Secondary | ICD-10-CM | POA: Diagnosis not present

## 2022-01-01 DIAGNOSIS — E559 Vitamin D deficiency, unspecified: Secondary | ICD-10-CM | POA: Diagnosis not present

## 2022-01-01 DIAGNOSIS — R5383 Other fatigue: Secondary | ICD-10-CM | POA: Diagnosis not present

## 2022-01-01 DIAGNOSIS — E782 Mixed hyperlipidemia: Secondary | ICD-10-CM | POA: Diagnosis not present

## 2022-01-01 DIAGNOSIS — E063 Autoimmune thyroiditis: Secondary | ICD-10-CM | POA: Diagnosis not present

## 2022-01-02 LAB — COMPREHENSIVE METABOLIC PANEL
ALT: 23 IU/L (ref 0–32)
AST: 23 IU/L (ref 0–40)
Albumin/Globulin Ratio: 2 (ref 1.2–2.2)
Albumin: 4.8 g/dL (ref 3.8–4.8)
Alkaline Phosphatase: 88 IU/L (ref 44–121)
BUN/Creatinine Ratio: 14 (ref 12–28)
BUN: 11 mg/dL (ref 8–27)
Bilirubin Total: 0.4 mg/dL (ref 0.0–1.2)
CO2: 26 mmol/L (ref 20–29)
Calcium: 9.5 mg/dL (ref 8.7–10.3)
Chloride: 100 mmol/L (ref 96–106)
Creatinine, Ser: 0.77 mg/dL (ref 0.57–1.00)
Globulin, Total: 2.4 g/dL (ref 1.5–4.5)
Glucose: 108 mg/dL — ABNORMAL HIGH (ref 70–99)
Potassium: 4.3 mmol/L (ref 3.5–5.2)
Sodium: 146 mmol/L — ABNORMAL HIGH (ref 134–144)
Total Protein: 7.2 g/dL (ref 6.0–8.5)
eGFR: 83 mL/min/{1.73_m2} (ref >59)

## 2022-01-02 LAB — LIPID PANEL WITH LDL/HDL RATIO
Cholesterol, Total: 188 mg/dL (ref 100–199)
HDL: 47 mg/dL (ref >39)
LDL Chol Calc (NIH): 116 mg/dL — ABNORMAL HIGH (ref 0–99)
LDL/HDL Ratio: 2.5 ratio (ref 0.0–3.2)
Triglycerides: 139 mg/dL (ref 0–149)
VLDL Cholesterol Cal: 25 mg/dL (ref 5–40)

## 2022-01-02 LAB — CBC WITH DIFFERENTIAL/PLATELET
Basophils Absolute: 0 10*3/uL (ref 0.0–0.2)
Basos: 1 %
EOS (ABSOLUTE): 0.3 10*3/uL (ref 0.0–0.4)
Eos: 5 %
Hematocrit: 43.2 % (ref 34.0–46.6)
Hemoglobin: 14.9 g/dL (ref 11.1–15.9)
Immature Grans (Abs): 0 10*3/uL (ref 0.0–0.1)
Immature Granulocytes: 0 %
Lymphocytes Absolute: 1.8 10*3/uL (ref 0.7–3.1)
Lymphs: 31 %
MCH: 32.3 pg (ref 26.6–33.0)
MCHC: 34.5 g/dL (ref 31.5–35.7)
MCV: 94 fL (ref 79–97)
Monocytes Absolute: 0.6 10*3/uL (ref 0.1–0.9)
Monocytes: 11 %
Neutrophils Absolute: 3 10*3/uL (ref 1.4–7.0)
Neutrophils: 52 %
Platelets: 289 10*3/uL (ref 150–450)
RBC: 4.61 x10E6/uL (ref 3.77–5.28)
RDW: 12.5 % (ref 11.7–15.4)
WBC: 5.8 10*3/uL (ref 3.4–10.8)

## 2022-01-02 LAB — B12 AND FOLATE PANEL
Folate: >20 ng/mL (ref >3.0)
Vitamin B-12: 495 pg/mL (ref 232–1245)

## 2022-01-02 LAB — VITAMIN D 25 HYDROXY (VIT D DEFICIENCY, FRACTURES): Vit D, 25-Hydroxy: 44.1 ng/mL (ref 30.0–100.0)

## 2022-01-02 LAB — TSH+FREE T4
Free T4: 1.52 ng/dL (ref 0.82–1.77)
TSH: 1.84 u[IU]/mL (ref 0.450–4.500)

## 2022-01-12 ENCOUNTER — Encounter: Payer: Self-pay | Admitting: Physician Assistant

## 2022-01-12 ENCOUNTER — Ambulatory Visit (INDEPENDENT_AMBULATORY_CARE_PROVIDER_SITE_OTHER): Payer: PPO | Admitting: Physician Assistant

## 2022-01-12 VITALS — BP 144/72 | HR 80 | Temp 98.8°F | Resp 16 | Ht 61.5 in | Wt 225.2 lb

## 2022-01-12 DIAGNOSIS — E782 Mixed hyperlipidemia: Secondary | ICD-10-CM | POA: Diagnosis not present

## 2022-01-12 DIAGNOSIS — E039 Hypothyroidism, unspecified: Secondary | ICD-10-CM | POA: Diagnosis not present

## 2022-01-12 DIAGNOSIS — I10 Essential (primary) hypertension: Secondary | ICD-10-CM

## 2022-01-12 DIAGNOSIS — Z6841 Body Mass Index (BMI) 40.0 and over, adult: Secondary | ICD-10-CM | POA: Diagnosis not present

## 2022-01-12 DIAGNOSIS — R7301 Impaired fasting glucose: Secondary | ICD-10-CM | POA: Diagnosis not present

## 2022-01-12 LAB — POCT GLYCOSYLATED HEMOGLOBIN (HGB A1C): Hemoglobin A1C: 5.7 % — AB (ref 4.0–5.6)

## 2022-01-12 MED ORDER — METFORMIN HCL 500 MG PO TABS: 500.0000 mg | ORAL_TABLET | Freq: Every day | ORAL | 1 refills | Status: DC

## 2022-01-12 MED ORDER — ATORVASTATIN CALCIUM 40 MG PO TABS: 40.0000 mg | ORAL_TABLET | Freq: Every day | ORAL | 3 refills | Status: DC

## 2022-01-12 NOTE — Progress Notes (Signed)
[facility] [address], [place] [address]  Internal MEDICINE  Office Visit Note  Patient Name: Savannah Franco  [id number]  [id number]  Date of Service: 01/13/2022  Chief Complaint  Patient presents with   Follow-up   Hypertension    HPI Pt is here for routine follow up for HTN -Was elevated last visit and has still only been taking 1.5tabs of hyzaar. Needs to get new BP cuff still as her current one was reading off from office readings last visit. Will start taking 2 tabs hyzaar since still elevated in office today -Labs reviewed-cholesterol increased some and will do better diet and fish oil. Will also increase her lipitor to 35m. Glucose also a little elevated so an A1c was done in office and found to be prediabetic. Will start on metformin to aid BG control and wt loss, if not tolerated or not improving would like to try alternative such as rybelsus  Current Medication: Outpatient Encounter Medications as of 01/12/2022  Medication Sig   aspirin 81 MG EC tablet Take 81 mg by mouth daily. Swallow whole.   atorvastatin (LIPITOR) 40 MG tablet Take 1 tablet (40 mg total) by mouth daily.   B Complex Vitamins (VITAMIN B COMPLEX PO) Take by mouth.   cholecalciferol (VITAMIN D) 1000 units tablet Take by mouth daily.   diclofenac sodium (VOLTAREN) 1 % GEL Apply 4 g topically 4 (four) times daily.   levothyroxine (SYNTHROID) 75 MCG tablet TAKE 1 TABLET BY MOUTH DAILY BEFORE BREAKFAST   losartan-hydrochlorothiazide (HYZAAR) 50-12.5 MG tablet TAKE 1 AND 1/2 TABLETS BY MOUTH DAILY   meclizine (ANTIVERT) 12.5 MG tablet Take 1 tablet (12.5 mg total) by mouth 2 (two) times daily as needed for dizziness.   metFORMIN (GLUCOPHAGE) 500 MG tablet Take 1 tablet (500 mg total) by mouth daily with breakfast.   Tdap (ADACEL) 12-30-13.5 LF-MCG/0.5 injection Inject 0.5 mLs into the muscle once.   [DISCONTINUED] atorvastatin (LIPITOR) 20 MG tablet Take 1 tablet by mouth daily   No  facility-administered encounter medications on file as of 01/12/2022.    Surgical History: Past Surgical History:  Procedure Laterality Date   BREAST CYST ASPIRATION Right    NEG   BREAST EXCISIONAL BIOPSY Left 2002   NEG   CATARACT EXTRACTION W/PHACO Right 10/12/2019   Procedure: CATARACT EXTRACTION PHACO AND INTRAOCULAR LENS PLACEMENT (IOC) RIGHT VISION BLUE 10.81  00:58.8  17.9%;  Surgeon: HMarchia Meiers MD;  Location: [facility]  Service: Ophthalmology;  Laterality: Right;   CATARACT EXTRACTION W/PHACO Left 07/29/2021   Procedure: CATARACT EXTRACTION PHACO AND INTRAOCULAR LENS PLACEMENT (IOC) LEFT 8.19 00:49.5;  Surgeon: PBirder Robson MD;  [facility]  Service: Ophthalmology;  Laterality: Left;   DENTAL SURGERY      Medical History: Past Medical History:  Diagnosis Date   Arthritis    right knee   Hyperlipidemia    Hypertension    Hypothyroidism    Vertigo    "years ago"   Wears dentures    full upper and lower    Family History: Family History  Problem Relation Age of Onset   Multiple myeloma Mother    Stroke Father    Hypertension Father    Breast cancer Neg Hx     Social History   Socioeconomic History   Marital status: Divorced    Spouse name: Not on file   Number of children: Not on file   Years of education: Not on file  Highest education level: Not on file  Occupational History   Not on file  Tobacco Use   Smoking status: Former    Types: Cigarettes    Quit date: 2005    Years since quitting: 18.3   Smokeless tobacco: Never  Vaping Use   Vaping Use: Never used  Substance and Sexual Activity   Alcohol use: Yes    Alcohol/week: 1.0 standard drink    Types: 1 Cans of beer per week    Comment: social (1 beer/week)   Drug use: No   Sexual activity: Not on file  Other Topics Concern   Not on file  Social History Narrative   Not on file   Social Determinants of Health   Financial Resource Strain: Not on file   Food Insecurity: Not on file  Transportation Needs: Not on file  Physical Activity: Not on file  Stress: Not on file  Social Connections: Not on file  Intimate Partner Violence: Not on file      Review of Systems  Constitutional:  Negative for chills, fatigue and unexpected weight change.  HENT:  Negative for congestion, postnasal drip, rhinorrhea, sneezing and sore throat.   Eyes:  Negative for redness.  Respiratory:  Negative for cough, chest tightness, shortness of breath and wheezing.   Cardiovascular:  Negative for chest pain and palpitations.  Gastrointestinal:  Negative for abdominal pain, constipation, diarrhea, nausea and vomiting.  Genitourinary:  Negative for dysuria and frequency.  Musculoskeletal:  Negative for arthralgias, back pain, joint swelling and neck pain.  Skin:  Negative for rash.  Neurological: Negative.  Negative for tremors and numbness.  Hematological:  Negative for adenopathy. Does not bruise/bleed easily.  Psychiatric/Behavioral:  Negative for behavioral problems (Depression), sleep disturbance and suicidal ideas. The patient is not nervous/anxious.    Vital Signs: BP (!) 144/72 Comment: 149/120  Pulse 80   Temp 98.8 F (37.1 C)   Resp 16   Ht 5' 1.5" (1.562 m)   Wt 225 lb 3.2 oz (102.2 kg)   SpO2 97%   BMI 41.86 kg/m    Physical Exam Vitals and nursing note reviewed.  Constitutional:      General: She is not in acute distress.    Appearance: She is well-developed. She is obese. She is not diaphoretic.  HENT:     Head: Normocephalic and atraumatic.     Mouth/Throat:     Pharynx: No oropharyngeal exudate.  Eyes:     Pupils: Pupils are equal, round, and reactive to light.  Neck:     Thyroid: No thyromegaly.     Vascular: No JVD.     Trachea: No tracheal deviation.  Cardiovascular:     Rate and Rhythm: Normal rate and regular rhythm.     Heart sounds: Normal heart sounds. No murmur heard.   No friction rub. No gallop.  Pulmonary:      Effort: Pulmonary effort is normal. No respiratory distress.     Breath sounds: No wheezing or rales.  Chest:     Chest wall: No tenderness.  Breasts:    Right: Normal. No mass.     Left: Normal. No mass.  Abdominal:     General: Bowel sounds are normal.     Palpations: Abdomen is soft.     Tenderness: There is no abdominal tenderness.  Musculoskeletal:        General: Normal range of motion.     Cervical back: Normal range of motion and neck supple.  Lymphadenopathy:  Cervical: No cervical adenopathy.  Skin:    General: Skin is warm and dry.  Neurological:     Mental Status: She is alert and oriented to person, place, and time.     Cranial Nerves: No cranial nerve deficit.  Psychiatric:        Behavior: Behavior normal.        Thought Content: Thought content normal.        Judgment: Judgment normal.       Assessment/Plan: 1. Essential hypertension Elevated in office still, will obtain new cuff for monitoring and will increase to 2 tabs of hyzaar. Will call if new script for the increased dose is needed  2. Impaired fasting blood sugar - POCT HgB A1C is 5.7 which is prediabetic. Will start metformin to help BG and wt loss. May need to consider alternative such as rybelsus - metFORMIN (GLUCOPHAGE) 500 MG tablet; Take 1 tablet (500 mg total) by mouth daily with breakfast.  Dispense: 30 tablet; Refill: 1  3. Mixed hyperlipidemia Will increase to 77m lipitor and work on diet and exercise. May also add fish oil. - atorvastatin (LIPITOR) 40 MG tablet; Take 1 tablet (40 mg total) by mouth daily.  Dispense: 90 tablet; Refill: 3  4. Acquired hypothyroidism Stable, continue current medication  5. Morbid obesity with BMI of 40.0-44.9, adult ([place] Will start metformin for BG and wt loss benefits, may need to try alternative such as rybelsus in future Obesity Counseling: Had a lengthy discussion regarding patients BMI and weight issues. Patient was instructed on portion  control as well as increased activity. Also discussed caloric restrictions with trying to maintain intake less than 2000 Kcal. Discussions were made in accordance with the 5As of weight management. Simple actions such as not eating late and if able to, taking a walk is suggested.    General Counseling: Gharolyn cockerunderstanding of the findings of todays visit and agrees with plan of treatment. I have discussed any further diagnostic evaluation that may be needed or ordered today. We also reviewed her medications today. she has been encouraged to call the office with any questions or concerns that should arise related to todays visit.    Orders Placed This Encounter  Procedures   POCT HgB A1C    Meds ordered this encounter  Medications   atorvastatin (LIPITOR) 40 MG tablet    Sig: Take 1 tablet (40 mg total) by mouth daily.    Dispense:  90 tablet    Refill:  3   metFORMIN (GLUCOPHAGE) 500 MG tablet    Sig: Take 1 tablet (500 mg total) by mouth daily with breakfast.    Dispense:  30 tablet    Refill:  1    This patient was seen by LDrema Dallas PA-C in collaboration with Dr. FClayborn Bignessas a part of collaborative care agreement.   Total time spent:30 Minutes Time spent includes review of chart, medications, test results, and follow up plan with the patient.      Dr FLavera GuiseInternal medicine

## 2022-02-13 ENCOUNTER — Encounter: Payer: Self-pay | Admitting: Physician Assistant

## 2022-02-13 ENCOUNTER — Ambulatory Visit (INDEPENDENT_AMBULATORY_CARE_PROVIDER_SITE_OTHER): Payer: PPO | Admitting: Physician Assistant

## 2022-02-13 VITALS — BP 142/72 | HR 73 | Temp 97.6°F | Resp 16 | Ht 61.5 in | Wt 224.2 lb

## 2022-02-13 DIAGNOSIS — Z6841 Body Mass Index (BMI) 40.0 and over, adult: Secondary | ICD-10-CM

## 2022-02-13 DIAGNOSIS — R7303 Prediabetes: Secondary | ICD-10-CM | POA: Diagnosis not present

## 2022-02-13 DIAGNOSIS — M79674 Pain in right toe(s): Secondary | ICD-10-CM

## 2022-02-13 DIAGNOSIS — E782 Mixed hyperlipidemia: Secondary | ICD-10-CM

## 2022-02-13 DIAGNOSIS — I10 Essential (primary) hypertension: Secondary | ICD-10-CM | POA: Diagnosis not present

## 2022-02-13 MED ORDER — LOSARTAN POTASSIUM-HCTZ 100-25 MG PO TABS: 1.0000 | ORAL_TABLET | Freq: Every day | ORAL | 1 refills | Status: DC

## 2022-02-13 NOTE — Progress Notes (Signed)
[facility] [place], [place] [address]  Internal MEDICINE  Office Visit Note  Patient Name: Savannah Franco  [id number]  [id number]  Date of Service: 02/27/2022  Chief Complaint  Patient presents with   Follow-up   Hypertension   Hyperlipidemia   Quality Metric Gaps    Pneumonia Vaccine    HPI Pt is here for routine follow up -Pinky toe on right foot hurts. About a week ago she was walking horse and states horse's shoe glanced off pinky toe but didn't actually step down on toe completely. It has been getting better. A little bruised before, but now improving. Does not hurt to palpation and has full ROM, hurts when shoes rub it or sometimes walking awhile. She is not interested in xray at this time and thinks it is healing fine and is not out of place, but will call if worsening or something changes. Taking aleve when necessary and soaking in epsom salt -Taking metformin and tolerating ok, but did have diarrhea twice though. Will try a little longer but may try for rybelsus if side effects worsening. Down 1 lb since last visit -140/77 on her cuff which is comparable to manual office reading, therefore seems to be accurate in readings-- BP at home 120/70s, occasionally up to 540 systolic and therefore usually well controlled. -taking 2tabs BP med--will send higher script now -tolerating $RemoveBeforeD'40mg'[id number]$  lipitor  Current Medication: Outpatient Encounter Medications as of 02/13/2022  Medication Sig   aspirin 81 MG EC tablet Take 81 mg by mouth daily. Swallow whole.   atorvastatin (LIPITOR) 40 MG tablet Take 1 tablet (40 mg total) by mouth daily.   B Complex Vitamins (VITAMIN B COMPLEX PO) Take by mouth.   cholecalciferol (VITAMIN D) 1000 units tablet Take by mouth daily.   diclofenac sodium (VOLTAREN) 1 % GEL Apply 4 g topically 4 (four) times daily.   levothyroxine (SYNTHROID) 75 MCG tablet TAKE 1 TABLET BY MOUTH DAILY BEFORE BREAKFAST   losartan-hydrochlorothiazide  (HYZAAR) 100-25 MG tablet Take 1 tablet by mouth daily.   meclizine (ANTIVERT) 12.5 MG tablet Take 1 tablet (12.5 mg total) by mouth 2 (two) times daily as needed for dizziness.   metFORMIN (GLUCOPHAGE) 500 MG tablet Take 1 tablet (500 mg total) by mouth daily with breakfast.   Tdap (ADACEL) 12-30-13.5 LF-MCG/0.5 injection Inject 0.5 mLs into the muscle once.   [DISCONTINUED] losartan-hydrochlorothiazide (HYZAAR) 50-12.5 MG tablet TAKE 1 AND 1/2 TABLETS BY MOUTH DAILY   No facility-administered encounter medications on file as of 02/13/2022.    Surgical History: Past Surgical History:  Procedure Laterality Date   BREAST CYST ASPIRATION Right    NEG   BREAST EXCISIONAL BIOPSY Left 2002   NEG   CATARACT EXTRACTION W/PHACO Right 10/12/2019   Procedure: CATARACT EXTRACTION PHACO AND INTRAOCULAR LENS PLACEMENT (IOC) RIGHT VISION BLUE 10.81  00:58.8  17.9%;  Surgeon: Marchia Meiers, MD;  Location: [facility];  Service: Ophthalmology;  Laterality: Right;   CATARACT EXTRACTION W/PHACO Left 07/29/2021   Procedure: CATARACT EXTRACTION PHACO AND INTRAOCULAR LENS PLACEMENT (IOC) LEFT 8.19 00:49.5;  Surgeon: Birder Robson, MD;  Location: [facility];  Service: Ophthalmology;  Laterality: Left;   DENTAL SURGERY      Medical History: Past Medical History:  Diagnosis Date   Arthritis    right knee   Hyperlipidemia    Hypertension    Hypothyroidism    Vertigo    "years ago"   Wears dentures    full upper  and lower    Family History: Family History  Problem Relation Age of Onset   Multiple myeloma Mother    Stroke Father    Hypertension Father    Breast cancer Neg Hx     Social History   Socioeconomic History   Marital status: Divorced    Spouse name: Not on file   Number of children: Not on file   Years of education: Not on file   Highest education level: Not on file  Occupational History   Not on file  Tobacco Use   Smoking status: Former    Types: Cigarettes     Quit date: 2005    Years since quitting: 18.5   Smokeless tobacco: Never  Vaping Use   Vaping Use: Never used  Substance and Sexual Activity   Alcohol use: Yes    Alcohol/week: 1.0 standard drink of alcohol    Types: 1 Cans of beer per week    Comment: social (1 beer/week)   Drug use: No   Sexual activity: Not on file  Other Topics Concern   Not on file  Social History Narrative   Not on file   Social Determinants of Health   Financial Resource Strain: Not on file  Food Insecurity: Not on file  Transportation Needs: Not on file  Physical Activity: Not on file  Stress: Not on file  Social Connections: Not on file  Intimate Partner Violence: Not on file      Review of Systems  Constitutional:  Negative for chills, fatigue and unexpected weight change.  HENT:  Negative for congestion, postnasal drip, rhinorrhea, sneezing and sore throat.   Eyes:  Negative for redness.  Respiratory:  Negative for cough, chest tightness, shortness of breath and wheezing.   Cardiovascular:  Negative for chest pain and palpitations.  Gastrointestinal:  Negative for abdominal pain, constipation, diarrhea, nausea and vomiting.  Genitourinary:  Negative for dysuria and frequency.  Musculoskeletal:  Positive for arthralgias. Negative for back pain, joint swelling and neck pain.       Pain in right pinky toe  Skin:  Negative for rash.  Neurological: Negative.  Negative for tremors and numbness.  Hematological:  Negative for adenopathy. Does not bruise/bleed easily.  Psychiatric/Behavioral:  Negative for behavioral problems (Depression), sleep disturbance and suicidal ideas. The patient is not nervous/anxious.     Vital Signs: BP (!) 142/72 Comment: 167/107  Pulse 73   Temp 97.6 F (36.4 C)   Resp 16   Ht 5' 1.5" (1.562 m)   Wt 224 lb 3.2 oz (101.7 kg)   SpO2 94%   BMI 41.68 kg/m    Physical Exam Vitals and nursing note reviewed.  Constitutional:      General: She is not in acute  distress.    Appearance: She is well-developed. She is obese. She is not diaphoretic.  HENT:     Head: Normocephalic and atraumatic.     Mouth/Throat:     Pharynx: No oropharyngeal exudate.  Eyes:     Pupils: Pupils are equal, round, and reactive to light.  Neck:     Thyroid: No thyromegaly.     Vascular: No JVD.     Trachea: No tracheal deviation.  Cardiovascular:     Rate and Rhythm: Normal rate and regular rhythm.     Heart sounds: Normal heart sounds. No murmur heard.    No friction rub. No gallop.  Pulmonary:     Effort: Pulmonary effort is normal. No respiratory distress.  Breath sounds: No wheezing or rales.  Chest:     Chest wall: No tenderness.  Breasts:    Right: Normal. No mass.     Left: Normal. No mass.  Abdominal:     General: Bowel sounds are normal.     Palpations: Abdomen is soft.     Tenderness: There is no abdominal tenderness.  Musculoskeletal:        General: Signs of injury present. Normal range of motion.     Cervical back: Normal range of motion and neck supple.     Comments: Right pinky toe slightly red and swollen, no tenderness to palpation and no obvious deformity  Lymphadenopathy:     Cervical: No cervical adenopathy.  Skin:    General: Skin is warm and dry.  Neurological:     Mental Status: She is alert and oriented to person, place, and time.     Cranial Nerves: No cranial nerve deficit.  Psychiatric:        Behavior: Behavior normal.        Thought Content: Thought content normal.        Judgment: Judgment normal.        Assessment/Plan: 1. Essential hypertension Will go ahead and send higher dose of hyzaar and continue to monitor - losartan-hydrochlorothiazide (HYZAAR) 100-25 MG tablet; Take 1 tablet by mouth daily.  Dispense: 90 tablet; Refill: 1  2. Mixed hyperlipidemia Continue lipitor  3. Pain in toe of right foot Greatly improved per pt since injury date and declines xray at this time. Since no obvious  deformity/tenderness to palpation will continue monitoring, however pt instructed to call if any worsening  4. Prediabetes Continue metformin, however if worsening S/E may stop and try alternative such as rybelsus  5. Morbid obesity with BMI of 40.0-44.9, adult (HCC) Down 1 lb since last visit. Continue metformin for sugar and wt loss benefits and working on diet and exercise   General Counseling: Tameka verbalizes understanding of the findings of todays visit and agrees with plan of treatment. I have discussed any further diagnostic evaluation that may be needed or ordered today. We also reviewed her medications today. she has been encouraged to call the office with any questions or concerns that should arise related to todays visit.    No orders of the defined types were placed in this encounter.   Meds ordered this encounter  Medications   losartan-hydrochlorothiazide (HYZAAR) 100-25 MG tablet    Sig: Take 1 tablet by mouth daily.    Dispense:  90 tablet    Refill:  1    This patient was seen by Drema Dallas, PA-C in collaboration with Dr. Clayborn Bigness as a part of collaborative care agreement.   Total time spent:30 Minutes Time spent includes review of chart, medications, test results, and follow up plan with the patient.      Dr Lavera Guise Internal medicine

## 2022-03-05 ENCOUNTER — Other Ambulatory Visit: Payer: Self-pay | Admitting: Physician Assistant

## 2022-03-05 DIAGNOSIS — E038 Other specified hypothyroidism: Secondary | ICD-10-CM

## 2022-03-09 ENCOUNTER — Other Ambulatory Visit: Payer: Self-pay | Admitting: Physician Assistant

## 2022-03-09 DIAGNOSIS — R7301 Impaired fasting glucose: Secondary | ICD-10-CM

## 2022-03-20 DIAGNOSIS — H26491 Other secondary cataract, right eye: Secondary | ICD-10-CM | POA: Diagnosis not present

## 2022-04-17 ENCOUNTER — Encounter: Payer: Self-pay | Admitting: Physician Assistant

## 2022-04-17 ENCOUNTER — Ambulatory Visit (INDEPENDENT_AMBULATORY_CARE_PROVIDER_SITE_OTHER): Payer: PPO | Admitting: Physician Assistant

## 2022-04-17 ENCOUNTER — Ambulatory Visit: Payer: PPO | Admitting: Physician Assistant

## 2022-04-17 VITALS — BP 140/70 | HR 65 | Temp 98.3°F | Resp 16 | Ht 61.5 in | Wt 222.0 lb

## 2022-04-17 DIAGNOSIS — Z6841 Body Mass Index (BMI) 40.0 and over, adult: Secondary | ICD-10-CM

## 2022-04-17 DIAGNOSIS — I10 Essential (primary) hypertension: Secondary | ICD-10-CM | POA: Diagnosis not present

## 2022-04-17 DIAGNOSIS — R7303 Prediabetes: Secondary | ICD-10-CM | POA: Diagnosis not present

## 2022-04-17 NOTE — Progress Notes (Signed)
[facility] [place], [place] [address]  Internal MEDICINE  Office Visit Note  Patient Name: Savannah Franco  [id number]  [id number]  Date of Service: 04/22/2022  Chief Complaint  Patient presents with   Follow-up   Hyperlipidemia   Hypertension   Quality Metric Gaps    Pneumonia Vaccine    HPI Pt is here for routine follow up -Down 2 lbs since last visit -Has been taking all her medications -BP at home checked occasionally, last week it was very good, this morning was 132/78 -Up today in office due to mix up with appts -Still tolerating metformin ok  Current Medication: Outpatient Encounter Medications as of 04/17/2022  Medication Sig   aspirin 81 MG EC tablet Take 81 mg by mouth daily. Swallow whole.   atorvastatin (LIPITOR) 40 MG tablet Take 1 tablet (40 mg total) by mouth daily.   B Complex Vitamins (VITAMIN B COMPLEX PO) Take by mouth.   cholecalciferol (VITAMIN D) 1000 units tablet Take by mouth daily.   diclofenac sodium (VOLTAREN) 1 % GEL Apply 4 g topically 4 (four) times daily.   levothyroxine (SYNTHROID) 75 MCG tablet TAKE 1 TABLET BY MOUTH DAILY BEFORE BREAKFAST.   losartan-hydrochlorothiazide (HYZAAR) 100-25 MG tablet Take 1 tablet by mouth daily.   meclizine (ANTIVERT) 12.5 MG tablet Take 1 tablet (12.5 mg total) by mouth 2 (two) times daily as needed for dizziness.   metFORMIN (GLUCOPHAGE) 500 MG tablet TAKE 1 TABLET(500 MG) BY MOUTH DAILY WITH BREAKFAST   Tdap (ADACEL) 12-30-13.5 LF-MCG/0.5 injection Inject 0.5 mLs into the muscle once.   No facility-administered encounter medications on file as of 04/17/2022.    Surgical History: Past Surgical History:  Procedure Laterality Date   BREAST CYST ASPIRATION Right    NEG   BREAST EXCISIONAL BIOPSY Left 2002   NEG   CATARACT EXTRACTION W/PHACO Right 10/12/2019   Procedure: CATARACT EXTRACTION PHACO AND INTRAOCULAR LENS PLACEMENT (IOC) RIGHT VISION BLUE 10.81  00:58.8  17.9%;   Surgeon: Marchia Meiers, MD;  Location: [facility];  Service: Ophthalmology;  Laterality: Right;   CATARACT EXTRACTION W/PHACO Left 07/29/2021   Procedure: CATARACT EXTRACTION PHACO AND INTRAOCULAR LENS PLACEMENT (IOC) LEFT 8.19 00:49.5;  Surgeon: Birder Robson, MD;  Location: [facility];  Service: Ophthalmology;  Laterality: Left;   DENTAL SURGERY      Medical History: Past Medical History:  Diagnosis Date   Arthritis    right knee   Hyperlipidemia    Hypertension    Hypothyroidism    Vertigo    "years ago"   Wears dentures    full upper and lower    Family History: Family History  Problem Relation Age of Onset   Multiple myeloma Mother    Stroke Father    Hypertension Father    Breast cancer Neg Hx     Social History   Socioeconomic History   Marital status: Divorced    Spouse name: Not on file   Number of children: Not on file   Years of education: Not on file   Highest education level: Not on file  Occupational History   Not on file  Tobacco Use   Smoking status: Former    Types: Cigarettes    Quit date: 2005    Years since quitting: 18.6   Smokeless tobacco: Never  Vaping Use   Vaping Use: Never used  Substance and Sexual Activity   Alcohol use: Yes    Alcohol/week: 1.0 standard drink  of alcohol    Types: 1 Cans of beer per week    Comment: social (1 beer/week)   Drug use: No   Sexual activity: Not on file  Other Topics Concern   Not on file  Social History Narrative   Not on file   Social Determinants of Health   Financial Resource Strain: Not on file  Food Insecurity: Not on file  Transportation Needs: Not on file  Physical Activity: Not on file  Stress: Not on file  Social Connections: Not on file  Intimate Partner Violence: Not on file      Review of Systems  Constitutional:  Negative for chills, fatigue and unexpected weight change.  HENT:  Negative for congestion, postnasal drip, rhinorrhea, sneezing and sore  throat.   Eyes:  Negative for redness.  Respiratory:  Negative for cough, chest tightness, shortness of breath and wheezing.   Cardiovascular:  Negative for chest pain and palpitations.  Gastrointestinal:  Negative for abdominal pain, constipation, diarrhea, nausea and vomiting.  Genitourinary:  Negative for dysuria and frequency.  Musculoskeletal:  Negative for arthralgias, back pain, joint swelling and neck pain.  Skin:  Negative for rash.  Neurological: Negative.  Negative for tremors and numbness.  Hematological:  Negative for adenopathy. Does not bruise/bleed easily.  Psychiatric/Behavioral:  Negative for behavioral problems (Depression), sleep disturbance and suicidal ideas. The patient is not nervous/anxious.     Vital Signs: BP (!) 140/70 Comment: 142/79  Pulse 65   Temp 98.3 F (36.8 C)   Resp 16   Ht 5' 1.5" (1.562 m)   Wt 222 lb (100.7 kg)   SpO2 96%   BMI 41.27 kg/m    Physical Exam Vitals and nursing note reviewed.  Constitutional:      General: She is not in acute distress.    Appearance: She is well-developed. She is obese. She is not diaphoretic.  HENT:     Head: Normocephalic and atraumatic.     Mouth/Throat:     Pharynx: No oropharyngeal exudate.  Eyes:     Pupils: Pupils are equal, round, and reactive to light.  Neck:     Thyroid: No thyromegaly.     Vascular: No JVD.     Trachea: No tracheal deviation.  Cardiovascular:     Rate and Rhythm: Normal rate and regular rhythm.     Heart sounds: Normal heart sounds. No murmur heard.    No friction rub. No gallop.  Pulmonary:     Effort: Pulmonary effort is normal. No respiratory distress.     Breath sounds: No wheezing or rales.  Chest:     Chest wall: No tenderness.  Breasts:    Right: Normal. No mass.     Left: Normal. No mass.  Abdominal:     General: Bowel sounds are normal.     Palpations: Abdomen is soft.     Tenderness: There is no abdominal tenderness.  Musculoskeletal:        General:  Normal range of motion.     Cervical back: Normal range of motion and neck supple.  Lymphadenopathy:     Cervical: No cervical adenopathy.  Skin:    General: Skin is warm and dry.  Neurological:     Mental Status: She is alert and oriented to person, place, and time.     Cranial Nerves: No cranial nerve deficit.  Psychiatric:        Behavior: Behavior normal.        Thought Content: Thought content  normal.        Judgment: Judgment normal.        Assessment/Plan: 1. Essential hypertension Borderline in office, but well controlled at home, continue current medications and monitoring  2. Prediabetes Continue metformin and improving diet and exercise  3. Morbid obesity with BMI of 40.0-44.9, adult (HCC) Down another 2lbs. Obesity Counseling: Had a lengthy discussion regarding patients BMI and weight issues. Patient was instructed on portion control as well as increased activity. Also discussed caloric restrictions with trying to maintain intake less than 2000 Kcal. Discussions were made in accordance with the 5As of weight management. Simple actions such as not eating late and if able to, taking a walk is suggested.    General Counseling: jniya madara understanding of the findings of todays visit and agrees with plan of treatment. I have discussed any further diagnostic evaluation that may be needed or ordered today. We also reviewed her medications today. she has been encouraged to call the office with any questions or concerns that should arise related to todays visit.    No orders of the defined types were placed in this encounter.   No orders of the defined types were placed in this encounter.   This patient was seen by Drema Dallas, PA-C in collaboration with Dr. Clayborn Bigness as a part of collaborative care agreement.   Total time spent:30 Minutes Time spent includes review of chart, medications, test results, and follow up plan with the patient.      Dr  Lavera Guise Internal medicine

## 2022-07-01 DIAGNOSIS — Z1231 Encounter for screening mammogram for malignant neoplasm of breast: Secondary | ICD-10-CM | POA: Diagnosis not present

## 2022-08-12 ENCOUNTER — Other Ambulatory Visit: Payer: Self-pay | Admitting: Physician Assistant

## 2022-08-12 DIAGNOSIS — I10 Essential (primary) hypertension: Secondary | ICD-10-CM

## 2022-08-14 ENCOUNTER — Ambulatory Visit: Payer: PPO | Admitting: Physician Assistant

## 2022-09-18 ENCOUNTER — Encounter: Payer: Self-pay | Admitting: Physician Assistant

## 2022-09-18 ENCOUNTER — Ambulatory Visit (INDEPENDENT_AMBULATORY_CARE_PROVIDER_SITE_OTHER): Payer: PPO | Admitting: Physician Assistant

## 2022-09-18 VITALS — BP 138/80 | HR 81 | Temp 98.5°F | Resp 16 | Ht 61.5 in | Wt 221.4 lb

## 2022-09-18 DIAGNOSIS — E782 Mixed hyperlipidemia: Secondary | ICD-10-CM | POA: Diagnosis not present

## 2022-09-18 DIAGNOSIS — R42 Dizziness and giddiness: Secondary | ICD-10-CM

## 2022-09-18 DIAGNOSIS — E559 Vitamin D deficiency, unspecified: Secondary | ICD-10-CM | POA: Diagnosis not present

## 2022-09-18 DIAGNOSIS — E039 Hypothyroidism, unspecified: Secondary | ICD-10-CM

## 2022-09-18 DIAGNOSIS — R5383 Other fatigue: Secondary | ICD-10-CM | POA: Diagnosis not present

## 2022-09-18 DIAGNOSIS — R7303 Prediabetes: Secondary | ICD-10-CM | POA: Diagnosis not present

## 2022-09-18 DIAGNOSIS — I10 Essential (primary) hypertension: Secondary | ICD-10-CM | POA: Diagnosis not present

## 2022-09-18 DIAGNOSIS — E538 Deficiency of other specified B group vitamins: Secondary | ICD-10-CM | POA: Diagnosis not present

## 2022-09-18 MED ORDER — MECLIZINE HCL 12.5 MG PO TABS: 12.5000 mg | ORAL_TABLET | Freq: Two times a day (BID) | ORAL | 2 refills | Status: AC | PRN

## 2022-09-18 NOTE — Progress Notes (Signed)
[facility] [place], [place] [address]  Internal MEDICINE  Office Visit Note  Patient Name: Savannah Franco  [id number]  [id number]  Date of Service: 09/18/2022  Chief Complaint  Patient presents with   Follow-up   Hyperlipidemia   Hypertension   Quality Metric Gaps    TDAP, Pneumonia    HPI Pt is here for routine follow up -Has not taken BP med today -BP at home has been good -Metformin not making a big difference in weight, but going to keep trying. Discussed it is more effective on sugar control than wt loss, however she has been slowly losing a few pounds since starting and would like to continue for now. -She does get dizzy occasionally and will take meclizine if needed. Her bottle is very old from 2017 and would like a new refill to have on hand if needed. -Down 1lb since last visit -Did slip and fall yesterday when she went outside. States it was muddy and her shoes didn't have good traction and slipped but did not injure herself and got right back up. She did not hit her head.  Current Medication: Outpatient Encounter Medications as of 09/18/2022  Medication Sig   aspirin 81 MG EC tablet Take 81 mg by mouth daily. Swallow whole.   atorvastatin (LIPITOR) 40 MG tablet Take 1 tablet (40 mg total) by mouth daily.   B Complex Vitamins (VITAMIN B COMPLEX PO) Take by mouth.   cholecalciferol (VITAMIN D) 1000 units tablet Take by mouth daily.   diclofenac sodium (VOLTAREN) 1 % GEL Apply 4 g topically 4 (four) times daily.   levothyroxine (SYNTHROID) 75 MCG tablet TAKE 1 TABLET BY MOUTH DAILY BEFORE BREAKFAST.   losartan-hydrochlorothiazide (HYZAAR) 100-25 MG tablet TAKE 1 TABLET BY MOUTH DAILY   metFORMIN (GLUCOPHAGE) 500 MG tablet TAKE 1 TABLET(500 MG) BY MOUTH DAILY WITH BREAKFAST   Tdap (ADACEL) 12-30-13.5 LF-MCG/0.5 injection Inject 0.5 mLs into the muscle once.   [DISCONTINUED] meclizine (ANTIVERT) 12.5 MG tablet Take 1 tablet (12.5 mg total) by  mouth 2 (two) times daily as needed for dizziness.   meclizine (ANTIVERT) 12.5 MG tablet Take 1 tablet (12.5 mg total) by mouth 2 (two) times daily as needed for dizziness.   No facility-administered encounter medications on file as of 09/18/2022.    Surgical History: Past Surgical History:  Procedure Laterality Date   BREAST CYST ASPIRATION Right    NEG   BREAST EXCISIONAL BIOPSY Left 2002   NEG   CATARACT EXTRACTION W/PHACO Right 10/12/2019   Procedure: CATARACT EXTRACTION PHACO AND INTRAOCULAR LENS PLACEMENT (IOC) RIGHT VISION BLUE 10.81  00:58.8  17.9%;  Surgeon: Marchia Meiers, MD;  Location: [facility];  Service: Ophthalmology;  Laterality: Right;   CATARACT EXTRACTION W/PHACO Left 07/29/2021   Procedure: CATARACT EXTRACTION PHACO AND INTRAOCULAR LENS PLACEMENT (IOC) LEFT 8.19 00:49.5;  Surgeon: Birder Robson, MD;  Location: [facility];  Service: Ophthalmology;  Laterality: Left;   DENTAL SURGERY      Medical History: Past Medical History:  Diagnosis Date   Arthritis    right knee   Hyperlipidemia    Hypertension    Hypothyroidism    Vertigo    "years ago"   Wears dentures    full upper and lower    Family History: Family History  Problem Relation Age of Onset   Multiple myeloma Mother    Stroke Father    Hypertension Father    Breast cancer Neg Hx  Social History   Socioeconomic History   Marital status: Divorced    Spouse name: Not on file   Number of children: Not on file   Years of education: Not on file   Highest education level: Not on file  Occupational History   Not on file  Tobacco Use   Smoking status: Former    Types: Cigarettes    Quit date: 2005    Years since quitting: 19.0   Smokeless tobacco: Never  Vaping Use   Vaping Use: Never used  Substance and Sexual Activity   Alcohol use: Yes    Alcohol/week: 1.0 standard drink of alcohol    Types: 1 Cans of beer per week    Comment: social (1 beer/week)   Drug use:  No   Sexual activity: Not on file  Other Topics Concern   Not on file  Social History Narrative   Not on file   Social Determinants of Health   Financial Resource Strain: Not on file  Food Insecurity: Not on file  Transportation Needs: Not on file  Physical Activity: Not on file  Stress: Not on file  Social Connections: Not on file  Intimate Partner Violence: Not on file      Review of Systems  Constitutional:  Negative for chills, fatigue and unexpected weight change.  HENT:  Negative for congestion, postnasal drip, rhinorrhea, sneezing and sore throat.   Eyes:  Negative for redness.  Respiratory:  Negative for cough, chest tightness, shortness of breath and wheezing.   Cardiovascular:  Negative for chest pain and palpitations.  Gastrointestinal:  Negative for abdominal pain, constipation, diarrhea, nausea and vomiting.  Genitourinary:  Negative for dysuria and frequency.  Musculoskeletal:  Negative for arthralgias, back pain, joint swelling and neck pain.  Skin:  Negative for rash.  Neurological: Negative.  Negative for tremors and numbness.  Hematological:  Negative for adenopathy. Does not bruise/bleed easily.  Psychiatric/Behavioral:  Negative for behavioral problems (Depression), sleep disturbance and suicidal ideas. The patient is not nervous/anxious.     Vital Signs: BP 138/80 Comment: 154/77  Pulse 81   Temp 98.5 F (36.9 C)   Resp 16   Ht 5' 1.5" (1.562 m)   Wt 221 lb 6.4 oz (100.4 kg)   SpO2 97%   BMI 41.16 kg/m    Physical Exam Vitals and nursing note reviewed.  Constitutional:      General: She is not in acute distress.    Appearance: She is well-developed. She is obese. She is not diaphoretic.  HENT:     Head: Normocephalic and atraumatic.     Mouth/Throat:     Pharynx: No oropharyngeal exudate.  Eyes:     Pupils: Pupils are equal, round, and reactive to light.  Neck:     Thyroid: No thyromegaly.     Vascular: No JVD.     Trachea: No  tracheal deviation.  Cardiovascular:     Rate and Rhythm: Normal rate and regular rhythm.     Heart sounds: Normal heart sounds. No murmur heard.    No friction rub. No gallop.  Pulmonary:     Effort: Pulmonary effort is normal. No respiratory distress.     Breath sounds: No wheezing or rales.  Chest:     Chest wall: No tenderness.  Breasts:    Right: Normal. No mass.     Left: Normal. No mass.  Abdominal:     General: Bowel sounds are normal.     Palpations: Abdomen is soft.  Musculoskeletal:        General: Normal range of motion.     Cervical back: Normal range of motion and neck supple.  Lymphadenopathy:     Cervical: No cervical adenopathy.  Skin:    General: Skin is warm and dry.  Neurological:     Mental Status: She is alert and oriented to person, place, and time.     Cranial Nerves: No cranial nerve deficit.  Psychiatric:        Behavior: Behavior normal.        Thought Content: Thought content normal.        Judgment: Judgment normal.        Assessment/Plan: 1. Essential hypertension Stable, continue current medication  2. Prediabetes Continue metformin and will update labs - Hgb A1C w/o eAG  3. Acquired hypothyroidism Continue synthroid and will update labs - TSH + free T4  4. Vertigo - meclizine (ANTIVERT) 12.5 MG tablet; Take 1 tablet (12.5 mg total) by mouth 2 (two) times daily as needed for dizziness.  Dispense: 20 tablet; Refill: 2  5. Mixed hyperlipidemia - Lipid Panel With LDL/HDL Ratio  6. Vitamin D deficiency - VITAMIN D 25 Hydroxy (Vit-D Deficiency, Fractures)  7. B12 deficiency - B12 and Folate Panel  8. Other fatigue - CBC w/Diff/Platelet - Comprehensive metabolic panel - TSH + free T4 - Lipid Panel With LDL/HDL Ratio - Hgb A1C w/o eAG - VITAMIN D 25 Hydroxy (Vit-D Deficiency, Fractures) - B12 and Folate Panel   General Counseling: [place] verbalizes understanding of the findings of todays visit and agrees with plan of  treatment. I have discussed any further diagnostic evaluation that may be needed or ordered today. We also reviewed her medications today. she has been encouraged to call the office with any questions or concerns that should arise related to todays visit.    Orders Placed This Encounter  Procedures   CBC w/Diff/Platelet   Comprehensive metabolic panel   TSH + free T4   Lipid Panel With LDL/HDL Ratio   Hgb A1C w/o eAG   VITAMIN D 25 Hydroxy (Vit-D Deficiency, Fractures)   B12 and Folate Panel    Meds ordered this encounter  Medications   meclizine (ANTIVERT) 12.5 MG tablet    Sig: Take 1 tablet (12.5 mg total) by mouth 2 (two) times daily as needed for dizziness.    Dispense:  20 tablet    Refill:  2    This patient was seen by Drema Dallas, PA-C in collaboration with Dr. Clayborn Bigness as a part of collaborative care agreement.   Total time spent:30 Minutes Time spent includes review of chart, medications, test results, and follow up plan with the patient.      Dr Lavera Guise Internal medicine

## 2022-11-30 ENCOUNTER — Other Ambulatory Visit: Payer: Self-pay | Admitting: Physician Assistant

## 2022-11-30 DIAGNOSIS — E063 Autoimmune thyroiditis: Secondary | ICD-10-CM

## 2022-12-21 ENCOUNTER — Ambulatory Visit: Payer: PPO | Admitting: Physician Assistant

## 2023-01-01 DIAGNOSIS — E559 Vitamin D deficiency, unspecified: Secondary | ICD-10-CM | POA: Diagnosis not present

## 2023-01-01 DIAGNOSIS — E039 Hypothyroidism, unspecified: Secondary | ICD-10-CM | POA: Diagnosis not present

## 2023-01-01 DIAGNOSIS — E538 Deficiency of other specified B group vitamins: Secondary | ICD-10-CM | POA: Diagnosis not present

## 2023-01-01 DIAGNOSIS — R5383 Other fatigue: Secondary | ICD-10-CM | POA: Diagnosis not present

## 2023-01-01 DIAGNOSIS — E782 Mixed hyperlipidemia: Secondary | ICD-10-CM | POA: Diagnosis not present

## 2023-01-01 DIAGNOSIS — R7303 Prediabetes: Secondary | ICD-10-CM | POA: Diagnosis not present

## 2023-01-02 LAB — COMPREHENSIVE METABOLIC PANEL
ALT: 19 IU/L (ref 0–32)
AST: 21 IU/L (ref 0–40)
Albumin/Globulin Ratio: 1.8 (ref 1.2–2.2)
Albumin: 4.4 g/dL (ref 3.8–4.8)
Alkaline Phosphatase: 80 IU/L (ref 44–121)
BUN/Creatinine Ratio: 19 (ref 12–28)
BUN: 14 mg/dL (ref 8–27)
Bilirubin Total: 0.4 mg/dL (ref 0.0–1.2)
CO2: 28 mmol/L (ref 20–29)
Calcium: 9.6 mg/dL (ref 8.7–10.3)
Chloride: 99 mmol/L (ref 96–106)
Creatinine, Ser: 0.73 mg/dL (ref 0.57–1.00)
Globulin, Total: 2.5 g/dL (ref 1.5–4.5)
Glucose: 101 mg/dL — ABNORMAL HIGH (ref 70–99)
Potassium: 4.2 mmol/L (ref 3.5–5.2)
Sodium: 142 mmol/L (ref 134–144)
Total Protein: 6.9 g/dL (ref 6.0–8.5)
eGFR: 88 mL/min/{1.73_m2} (ref >59)

## 2023-01-02 LAB — CBC WITH DIFFERENTIAL/PLATELET
Basophils Absolute: 0 10*3/uL (ref 0.0–0.2)
Basos: 1 %
EOS (ABSOLUTE): 0.2 10*3/uL (ref 0.0–0.4)
Eos: 4 %
Hematocrit: 42.7 % (ref 34.0–46.6)
Hemoglobin: 14.1 g/dL (ref 11.1–15.9)
Immature Grans (Abs): 0 10*3/uL (ref 0.0–0.1)
Immature Granulocytes: 0 %
Lymphocytes Absolute: 1.9 10*3/uL (ref 0.7–3.1)
Lymphs: 32 %
MCH: 31.3 pg (ref 26.6–33.0)
MCHC: 33 g/dL (ref 31.5–35.7)
MCV: 95 fL (ref 79–97)
Monocytes Absolute: 0.4 10*3/uL (ref 0.1–0.9)
Monocytes: 7 %
Neutrophils Absolute: 3.4 10*3/uL (ref 1.4–7.0)
Neutrophils: 56 %
Platelets: 272 10*3/uL (ref 150–450)
RBC: 4.5 x10E6/uL (ref 3.77–5.28)
RDW: 12.5 % (ref 11.7–15.4)
WBC: 5.9 10*3/uL (ref 3.4–10.8)

## 2023-01-02 LAB — LIPID PANEL WITH LDL/HDL RATIO
Cholesterol, Total: 155 mg/dL (ref 100–199)
HDL: 46 mg/dL (ref >39)
LDL Chol Calc (NIH): 86 mg/dL (ref 0–99)
LDL/HDL Ratio: 1.9 ratio (ref 0.0–3.2)
Triglycerides: 132 mg/dL (ref 0–149)
VLDL Cholesterol Cal: 23 mg/dL (ref 5–40)

## 2023-01-02 LAB — HGB A1C W/O EAG: Hgb A1c MFr Bld: 5.8 % — ABNORMAL HIGH (ref 4.8–5.6)

## 2023-01-02 LAB — TSH+FREE T4
Free T4: 1.47 ng/dL (ref 0.82–1.77)
TSH: 1.46 u[IU]/mL (ref 0.450–4.500)

## 2023-01-02 LAB — B12 AND FOLATE PANEL
Folate: >20 ng/mL (ref >3.0)
Vitamin B-12: 568 pg/mL (ref 232–1245)

## 2023-01-02 LAB — VITAMIN D 25 HYDROXY (VIT D DEFICIENCY, FRACTURES): Vit D, 25-Hydroxy: 33 ng/mL (ref 30.0–100.0)

## 2023-01-08 ENCOUNTER — Encounter: Payer: Self-pay | Admitting: Physician Assistant

## 2023-01-08 ENCOUNTER — Ambulatory Visit (INDEPENDENT_AMBULATORY_CARE_PROVIDER_SITE_OTHER): Payer: PPO | Admitting: Physician Assistant

## 2023-01-08 VITALS — BP 123/85 | HR 76 | Temp 98.4°F | Resp 16 | Ht 61.5 in | Wt 224.0 lb

## 2023-01-08 DIAGNOSIS — R7303 Prediabetes: Secondary | ICD-10-CM | POA: Diagnosis not present

## 2023-01-08 DIAGNOSIS — I10 Essential (primary) hypertension: Secondary | ICD-10-CM

## 2023-01-08 DIAGNOSIS — E782 Mixed hyperlipidemia: Secondary | ICD-10-CM | POA: Diagnosis not present

## 2023-01-08 DIAGNOSIS — Z6841 Body Mass Index (BMI) 40.0 and over, adult: Secondary | ICD-10-CM | POA: Diagnosis not present

## 2023-01-08 DIAGNOSIS — R300 Dysuria: Secondary | ICD-10-CM | POA: Diagnosis not present

## 2023-01-08 DIAGNOSIS — E559 Vitamin D deficiency, unspecified: Secondary | ICD-10-CM | POA: Diagnosis not present

## 2023-01-08 DIAGNOSIS — E039 Hypothyroidism, unspecified: Secondary | ICD-10-CM

## 2023-01-08 DIAGNOSIS — Z0001 Encounter for general adult medical examination with abnormal findings: Secondary | ICD-10-CM | POA: Diagnosis not present

## 2023-01-08 NOTE — Addendum Note (Signed)
Addended by: Annamaria Helling on: 01/08/2023 12:14 PM   Modules accepted: Orders

## 2023-01-08 NOTE — Progress Notes (Signed)
[facility] [address] [place], [place] [address]  Internal MEDICINE  Office Visit Note  Patient Name: Savannah Franco  [id number]  [id number]  Date of Service: 01/08/2023  Chief Complaint  Patient presents with   Medicare Wellness   Hypertension   Hyperlipidemia     HPI Pt is here for routine health maintenance examination -Trying to increase water intake and has an app that helps with this -Has an app to help tracking steps but has been difficult to keep up with this as she doesn't carry her phone with her to track always and doesn't have a watch that tracks. Will try carrying phone in pocket to help, but jsut motivated to increase steps in general -had mammogram in Nov -Still working on getting vaccines done at health dept -Labs reviewed shows improved cholesterol, A1c a little worse but still prediabetic at 5.8. Did stop metformin because of some leg aches and read some possible side effects so stopped it. Vit D borderline low and will check supplement as she has been taking lower dose recently.  Current Medication: Outpatient Encounter Medications as of 01/08/2023  Medication Sig   aspirin 81 MG EC tablet Take 81 mg by mouth daily. Swallow whole.   atorvastatin (LIPITOR) 40 MG tablet Take 1 tablet (40 mg total) by mouth daily.   B Complex Vitamins (VITAMIN B COMPLEX PO) Take by mouth.   cholecalciferol (VITAMIN D) 1000 units tablet Take by mouth daily.   diclofenac sodium (VOLTAREN) 1 % GEL Apply 4 g topically 4 (four) times daily.   levothyroxine (SYNTHROID) 75 MCG tablet TAKE 1 TABLET BY MOUTH DAILY BEFORE BREAKFAST   losartan-hydrochlorothiazide (HYZAAR) 100-25 MG tablet TAKE 1 TABLET BY MOUTH DAILY   meclizine (ANTIVERT) 12.5 MG tablet Take 1 tablet (12.5 mg total) by mouth 2 (two) times daily as needed for dizziness.   Tdap (ADACEL) 12-30-13.5 LF-MCG/0.5 injection Inject 0.5 mLs into the muscle once.   [DISCONTINUED] metFORMIN (GLUCOPHAGE) 500 MG tablet  TAKE 1 TABLET(500 MG) BY MOUTH DAILY WITH BREAKFAST   No facility-administered encounter medications on file as of 01/08/2023.    Surgical History: Past Surgical History:  Procedure Laterality Date   BREAST CYST ASPIRATION Right    NEG   BREAST EXCISIONAL BIOPSY Left 2002   NEG   CATARACT EXTRACTION W/PHACO Right 10/12/2019   Procedure: CATARACT EXTRACTION PHACO AND INTRAOCULAR LENS PLACEMENT (IOC) RIGHT VISION BLUE 10.81  00:58.8  17.9%;  Surgeon: Elliot Cousin, MD;  Location: [facility];  Service: Ophthalmology;  Laterality: Right;   CATARACT EXTRACTION W/PHACO Left 07/29/2021   Procedure: CATARACT EXTRACTION PHACO AND INTRAOCULAR LENS PLACEMENT (IOC) LEFT 8.19 00:49.5;  Surgeon: Galen Manila, MD;  Location: [facility];  Service: Ophthalmology;  Laterality: Left;   DENTAL SURGERY      Medical History: Past Medical History:  Diagnosis Date   Arthritis    right knee   Hyperlipidemia    Hypertension    Hypothyroidism    Vertigo    "years ago"   Wears dentures    full upper and lower    Family History: Family History  Problem Relation Age of Onset   Multiple myeloma Mother    Stroke Father    Hypertension Father    Breast cancer Neg Hx       Review of Systems  Constitutional:  Negative for chills, fatigue and unexpected weight change.  HENT:  Negative for congestion, postnasal drip, rhinorrhea, sneezing and sore throat.   Eyes:  Negative for redness.  Respiratory:  Negative for cough, chest tightness, shortness of breath and wheezing.   Cardiovascular:  Negative for chest pain and palpitations.  Gastrointestinal:  Negative for abdominal pain, constipation, diarrhea, nausea and vomiting.  Genitourinary:  Negative for dysuria and frequency.  Musculoskeletal:  Negative for arthralgias, back pain, joint swelling and neck pain.  Skin:  Negative for rash.  Neurological: Negative.  Negative for tremors and numbness.  Hematological:  Negative for  adenopathy. Does not bruise/bleed easily.  Psychiatric/Behavioral:  Negative for behavioral problems (Depression), sleep disturbance and suicidal ideas. The patient is not nervous/anxious.      Vital Signs: BP 123/85   Pulse 76   Temp 98.4 F (36.9 C)   Resp 16   Ht 5' 1.5" (1.562 m)   Wt 224 lb (101.6 kg)   SpO2 97%   BMI 41.64 kg/m    Physical Exam Vitals and nursing note reviewed.  Constitutional:      General: She is not in acute distress.    Appearance: She is well-developed. She is obese. She is not diaphoretic.  HENT:     Head: Normocephalic and atraumatic.     Mouth/Throat:     Pharynx: No oropharyngeal exudate.  Eyes:     Pupils: Pupils are equal, round, and reactive to light.  Neck:     Thyroid: No thyromegaly.     Vascular: No JVD.     Trachea: No tracheal deviation.  Cardiovascular:     Rate and Rhythm: Normal rate and regular rhythm.     Heart sounds: Normal heart sounds. No murmur heard.    No friction rub. No gallop.  Pulmonary:     Effort: Pulmonary effort is normal. No respiratory distress.     Breath sounds: No wheezing or rales.  Chest:     Chest wall: No tenderness.  Breasts:    Right: Normal. No mass.     Left: Normal. No mass.  Abdominal:     General: Bowel sounds are normal.     Palpations: Abdomen is soft.     Tenderness: There is no abdominal tenderness.  Musculoskeletal:        General: Normal range of motion.     Cervical back: Normal range of motion and neck supple.  Lymphadenopathy:     Cervical: No cervical adenopathy.  Skin:    General: Skin is warm and dry.  Neurological:     Mental Status: She is alert and oriented to person, place, and time.     Cranial Nerves: No cranial nerve deficit.  Psychiatric:        Behavior: Behavior normal.        Thought Content: Thought content normal.        Judgment: Judgment normal.      LABS: Recent Results (from the past 2160 hour(s))  CBC w/Diff/Platelet     Status: None    Collection Time: 01/01/23  8:58 AM  Result Value Ref Range   WBC 5.9 3.4 - 10.8 x10E3/uL   RBC 4.50 3.77 - 5.28 x10E6/uL   Hemoglobin 14.1 11.1 - 15.9 g/dL   Hematocrit 13.0 86.5 - 46.6 %   MCV 95 79 - 97 fL   MCH 31.3 26.6 - 33.0 pg   MCHC 33.0 31.5 - 35.7 g/dL   RDW 78.4 69.6 - 29.5 %   Platelets 272 150 - 450 x10E3/uL   Neutrophils 56 Not Estab. %   Lymphs 32 Not Estab. %   Monocytes 7  Not Estab. %   Eos 4 Not Estab. %   Basos 1 Not Estab. %   Neutrophils Absolute 3.4 1.4 - 7.0 x10E3/uL   Lymphocytes Absolute 1.9 0.7 - 3.1 x10E3/uL   Monocytes Absolute 0.4 0.1 - 0.9 x10E3/uL   EOS (ABSOLUTE) 0.2 0.0 - 0.4 x10E3/uL   Basophils Absolute 0.0 0.0 - 0.2 x10E3/uL   Immature Granulocytes 0 Not Estab. %   Immature Grans (Abs) 0.0 0.0 - 0.1 x10E3/uL  Comprehensive metabolic panel     Status: Abnormal   Collection Time: 01/01/23  8:58 AM  Result Value Ref Range   Glucose 101 (H) 70 - 99 mg/dL   BUN 14 8 - 27 mg/dL   Creatinine, Ser 4.09 0.57 - 1.00 mg/dL   eGFR 88 >81 XB/JYN/8.29   BUN/Creatinine Ratio 19 12 - 28   Sodium 142 134 - 144 mmol/L   Potassium 4.2 3.5 - 5.2 mmol/L   Chloride 99 96 - 106 mmol/L   CO2 28 20 - 29 mmol/L   Calcium 9.6 8.7 - 10.3 mg/dL   Total Protein 6.9 6.0 - 8.5 g/dL   Albumin 4.4 3.8 - 4.8 g/dL   Globulin, Total 2.5 1.5 - 4.5 g/dL   Albumin/Globulin Ratio 1.8 1.2 - 2.2   Bilirubin Total 0.4 0.0 - 1.2 mg/dL   Alkaline Phosphatase 80 44 - 121 IU/L   AST 21 0 - 40 IU/L   ALT 19 0 - 32 IU/L  TSH + free T4     Status: None   Collection Time: 01/01/23  8:58 AM  Result Value Ref Range   TSH 1.460 0.450 - 4.500 uIU/mL   Free T4 1.47 0.82 - 1.77 ng/dL  Lipid Panel With LDL/HDL Ratio     Status: None   Collection Time: 01/01/23  8:58 AM  Result Value Ref Range   Cholesterol, Total 155 100 - 199 mg/dL   Triglycerides 562 0 - 149 mg/dL   HDL 46 >13 mg/dL   VLDL Cholesterol Cal 23 5 - 40 mg/dL   LDL Chol Calc (NIH) 86 0 - 99 mg/dL   LDL/HDL Ratio 1.9  0.0 - 3.2 ratio    Comment:                                     LDL/HDL Ratio                                             Men  Women                               1/2 Avg.Risk  1.0    1.5                                   Avg.Risk  3.6    3.2                                2X Avg.Risk  6.2    5.0  3X Avg.Risk  8.0    6.1   Hgb A1C w/o eAG     Status: Abnormal   Collection Time: 01/01/23  8:58 AM  Result Value Ref Range   Hgb A1c MFr Bld 5.8 (H) 4.8 - 5.6 %    Comment:          Prediabetes: 5.7 - 6.4          Diabetes: >6.4          Glycemic control for adults with diabetes: <7.0   VITAMIN D 25 Hydroxy (Vit-D Deficiency, Fractures)     Status: None   Collection Time: 01/01/23  8:58 AM  Result Value Ref Range   Vit D, 25-Hydroxy 33.0 30.0 - 100.0 ng/mL    Comment: Vitamin D deficiency has been defined by the Institute of Medicine and an Endocrine Society practice guideline as a level of serum 25-OH vitamin D less than 20 ng/mL (1,2). The Endocrine Society went on to further define vitamin D insufficiency as a level between 21 and 29 ng/mL (2). 1. IOM (Institute of Medicine). 2010. Dietary reference    intakes for calcium and D. [place]: The    [employer]. 2. Holick MF, Binkley [place], Bischoff-Ferrari HA, et al.    Evaluation, treatment, and prevention of vitamin D    deficiency: an Endocrine Society clinical practice    guideline. JCEM. 2011 Jul; 96(7):1911-30.   B12 and Folate Panel     Status: None   Collection Time: 01/01/23  8:58 AM  Result Value Ref Range   Vitamin B-12 568 232 - 1,245 pg/mL   Folate >20.0 >3.0 ng/mL    Comment: A serum folate concentration of less than 3.1 ng/mL is considered to represent clinical deficiency.        Assessment/Plan: 1. Encounter for general adult medical examination with abnormal findings CPE performed, labs reviewed, due for vaccines, otherwise UTD on PHM  2. Essential  hypertension Stable, continue current medication  3. Prediabetes A1c at 5.8 in prediabetic range still. Continue to work on diet and exercise  4. Acquired hypothyroidism Stable, continue synthroid as before  5. Vitamin D deficiency Will increase to 1000IU daily as still borderline low  6. Mixed hyperlipidemia Much improved, continue lipitor  7. Morbid obesity with BMI of 40.0-44.9, adult (HCC) Continue to work on diet and exercise   General Counseling: Katheryn verbalizes understanding of the findings of todays visit and agrees with plan of treatment. I have discussed any further diagnostic evaluation that may be needed or ordered today. We also reviewed her medications today. she has been encouraged to call the office with any questions or concerns that should arise related to todays visit.    Counseling:    No orders of the defined types were placed in this encounter.   No orders of the defined types were placed in this encounter.   This patient was seen by Lynn Ito, PA-C in collaboration with Dr. Beverely Risen as a part of collaborative care agreement.  Total time spent:35 Minutes  Time spent includes review of chart, medications, test results, and follow up plan with the patient.     Lyndon Code, MD  Internal Medicine

## 2023-01-09 LAB — MICROSCOPIC EXAMINATION
Bacteria, UA: NONE SEEN
Casts: NONE SEEN /lpf
RBC, Urine: NONE SEEN /hpf (ref 0–2)
WBC, UA: NONE SEEN /hpf (ref 0–5)

## 2023-01-09 LAB — UA/M W/RFLX CULTURE, ROUTINE
Bilirubin, UA: NEGATIVE
Glucose, UA: NEGATIVE
Ketones, UA: NEGATIVE
Leukocytes,UA: NEGATIVE
Nitrite, UA: NEGATIVE
Protein,UA: NEGATIVE
RBC, UA: NEGATIVE
Specific Gravity, UA: <=1.005 — AB (ref 1.005–1.030)
Urobilinogen, Ur: 0.2 mg/dL (ref 0.2–1.0)
pH, UA: 7 (ref 5.0–7.5)

## 2023-01-21 ENCOUNTER — Other Ambulatory Visit: Payer: Self-pay | Admitting: Physician Assistant

## 2023-01-21 DIAGNOSIS — E782 Mixed hyperlipidemia: Secondary | ICD-10-CM

## 2023-02-18 ENCOUNTER — Other Ambulatory Visit: Payer: Self-pay | Admitting: Physician Assistant

## 2023-02-18 DIAGNOSIS — I10 Essential (primary) hypertension: Secondary | ICD-10-CM

## 2023-04-14 DIAGNOSIS — H43813 Vitreous degeneration, bilateral: Secondary | ICD-10-CM | POA: Diagnosis not present

## 2023-04-14 DIAGNOSIS — H353131 Nonexudative age-related macular degeneration, bilateral, early dry stage: Secondary | ICD-10-CM | POA: Diagnosis not present

## 2023-05-13 ENCOUNTER — Encounter: Payer: Self-pay | Admitting: Physician Assistant

## 2023-05-13 ENCOUNTER — Telehealth: Payer: Self-pay | Admitting: Physician Assistant

## 2023-05-13 ENCOUNTER — Ambulatory Visit (INDEPENDENT_AMBULATORY_CARE_PROVIDER_SITE_OTHER): Payer: PPO | Admitting: Physician Assistant

## 2023-05-13 VITALS — BP 130/88 | HR 95 | Temp 97.8°F | Resp 16 | Ht 61.5 in | Wt 229.6 lb

## 2023-05-13 DIAGNOSIS — L918 Other hypertrophic disorders of the skin: Secondary | ICD-10-CM | POA: Diagnosis not present

## 2023-05-13 DIAGNOSIS — Z1283 Encounter for screening for malignant neoplasm of skin: Secondary | ICD-10-CM

## 2023-05-13 DIAGNOSIS — I10 Essential (primary) hypertension: Secondary | ICD-10-CM

## 2023-05-13 DIAGNOSIS — Z6841 Body Mass Index (BMI) 40.0 and over, adult: Secondary | ICD-10-CM

## 2023-05-13 NOTE — Progress Notes (Signed)
[facility] [address] [place], [place] [address]  Internal MEDICINE  Office Visit Note  Patient Name: Savannah Franco  [id number]  [id number]  Date of Service: 05/14/2023  Chief Complaint  Patient presents with   Follow-up   Hyperlipidemia   Hypertension   Quality Metric Gaps    Colonoscopy, Shingles    HPI Pt is here for routine follow up -went to eye dr last month and was given an eye vitamin that has vit d in it now instead of 5000 daily which was too much and made her feel off. Will take just once per week now. -ocuvite with vit D -did gain some weight from stress eating. Going outside helps.  -wants to hold on colon screening, due in Dec -going to go to HD for shingles vaccines  Current Medication: Outpatient Encounter Medications as of 05/13/2023  Medication Sig   aspirin 81 MG EC tablet Take 81 mg by mouth daily. Swallow whole.   atorvastatin (LIPITOR) 40 MG tablet TAKE 1 TABLET(40 MG) BY MOUTH DAILY   B Complex Vitamins (VITAMIN B COMPLEX PO) Take by mouth.   cholecalciferol (VITAMIN D) 1000 units tablet Take by mouth daily.   diclofenac sodium (VOLTAREN) 1 % GEL Apply 4 g topically 4 (four) times daily.   levothyroxine (SYNTHROID) 75 MCG tablet TAKE 1 TABLET BY MOUTH DAILY BEFORE BREAKFAST   losartan-hydrochlorothiazide (HYZAAR) 100-25 MG tablet TAKE 1 TABLET BY MOUTH DAILY   meclizine (ANTIVERT) 12.5 MG tablet Take 1 tablet (12.5 mg total) by mouth 2 (two) times daily as needed for dizziness.   Tdap (ADACEL) 12-30-13.5 LF-MCG/0.5 injection Inject 0.5 mLs into the muscle once.   No facility-administered encounter medications on file as of 05/13/2023.    Surgical History: Past Surgical History:  Procedure Laterality Date   BREAST CYST ASPIRATION Right    NEG   BREAST EXCISIONAL BIOPSY Left 2002   NEG   CATARACT EXTRACTION W/PHACO Right 10/12/2019   Procedure: CATARACT EXTRACTION PHACO AND INTRAOCULAR LENS PLACEMENT (IOC) RIGHT VISION BLUE  10.81  00:58.8  17.9%;  Surgeon: Elliot Cousin, MD;  Location: [facility];  Service: Ophthalmology;  Laterality: Right;   CATARACT EXTRACTION W/PHACO Left 07/29/2021   Procedure: CATARACT EXTRACTION PHACO AND INTRAOCULAR LENS PLACEMENT (IOC) LEFT 8.19 00:49.5;  Surgeon: Galen Manila, MD;  Location: [facility];  Service: Ophthalmology;  Laterality: Left;   DENTAL SURGERY      Medical History: Past Medical History:  Diagnosis Date   Arthritis    right knee   Hyperlipidemia    Hypertension    Hypothyroidism    Vertigo    "years ago"   Wears dentures    full upper and lower    Family History: Family History  Problem Relation Age of Onset   Multiple myeloma Mother    Stroke Father    Hypertension Father    Breast cancer Neg Hx     Social History   Socioeconomic History   Marital status: Divorced    Spouse name: Not on file   Number of children: Not on file   Years of education: Not on file   Highest education level: Not on file  Occupational History   Not on file  Tobacco Use   Smoking status: Former    Current packs/day: 0.00    Types: Cigarettes    Quit date: 2005    Years since quitting: 19.7   Smokeless tobacco: Never  Vaping Use   Vaping status:  Never Used  Substance and Sexual Activity   Alcohol use: Yes    Alcohol/week: 1.0 standard drink of alcohol    Types: 1 Cans of beer per week    Comment: social (1 beer/week)   Drug use: No   Sexual activity: Not on file  Other Topics Concern   Not on file  Social History Narrative   Not on file   Social Determinants of Health   Financial Resource Strain: Not on file  Food Insecurity: Not on file  Transportation Needs: Not on file  Physical Activity: Not on file  Stress: Not on file  Social Connections: Not on file  Intimate Partner Violence: Not on file      Review of Systems  Constitutional:  Negative for chills, fatigue and unexpected weight change.  HENT:  Negative for  congestion, postnasal drip, rhinorrhea, sneezing and sore throat.   Eyes:  Negative for redness.  Respiratory:  Negative for cough, chest tightness, shortness of breath and wheezing.   Cardiovascular:  Negative for chest pain and palpitations.  Gastrointestinal:  Negative for abdominal pain, constipation, diarrhea, nausea and vomiting.  Genitourinary:  Negative for dysuria and frequency.  Musculoskeletal:  Negative for arthralgias, back pain, joint swelling and neck pain.  Skin:  Negative for rash.  Neurological: Negative.  Negative for tremors and numbness.  Hematological:  Negative for adenopathy. Does not bruise/bleed easily.  Psychiatric/Behavioral:  Negative for behavioral problems (Depression), sleep disturbance and suicidal ideas. The patient is not nervous/anxious.     Vital Signs: BP 130/88 Comment: 140/90  Pulse 95   Temp 97.8 F (36.6 C)   Resp 16   Ht 5' 1.5" (1.562 m)   Wt 229 lb 9.6 oz (104.1 kg)   SpO2 97%   BMI 42.68 kg/m    Physical Exam Vitals and nursing note reviewed.  Constitutional:      General: She is not in acute distress.    Appearance: She is well-developed. She is obese. She is not diaphoretic.  HENT:     Head: Normocephalic and atraumatic.     Mouth/Throat:     Pharynx: No oropharyngeal exudate.  Eyes:     Pupils: Pupils are equal, round, and reactive to light.  Neck:     Thyroid: No thyromegaly.     Vascular: No JVD.     Trachea: No tracheal deviation.  Cardiovascular:     Rate and Rhythm: Normal rate and regular rhythm.     Heart sounds: Normal heart sounds. No murmur heard.    No friction rub. No gallop.  Pulmonary:     Effort: Pulmonary effort is normal. No respiratory distress.     Breath sounds: No wheezing or rales.  Chest:     Chest wall: No tenderness.  Breasts:    Right: Normal. No mass.     Left: Normal. No mass.  Abdominal:     General: Bowel sounds are normal.     Palpations: Abdomen is soft.  Musculoskeletal:         General: Normal range of motion.     Cervical back: Normal range of motion and neck supple.  Lymphadenopathy:     Cervical: No cervical adenopathy.  Skin:    General: Skin is warm and dry.  Neurological:     Mental Status: She is alert and oriented to person, place, and time.     Cranial Nerves: No cranial nerve deficit.  Psychiatric:        Behavior: Behavior normal.  Thought Content: Thought content normal.        Judgment: Judgment normal.        Assessment/Plan: 1. Essential hypertension Stable, continue current medication  2. Acquired skin tag Skin tag along eyelid is bothersome and would like removed, referred to dermatology - Ambulatory referral to Dermatology  3. Screening exam for skin cancer - Ambulatory referral to Dermatology  4. Morbid obesity with BMI of 40.0-44.9, adult (HCC) Will continue to work on diet and exercise   General Counseling: Nimra verbalizes understanding of the findings of todays visit and agrees with plan of treatment. I have discussed any further diagnostic evaluation that may be needed or ordered today. We also reviewed her medications today. she has been encouraged to call the office with any questions or concerns that should arise related to todays visit.    Orders Placed This Encounter  Procedures   Ambulatory referral to Dermatology    No orders of the defined types were placed in this encounter.   This patient was seen by Lynn Ito, PA-C in collaboration with Dr. Beverely Risen as a part of collaborative care agreement.   Total time spent:30 Minutes Time spent includes review of chart, medications, test results, and follow up plan with the patient.      Dr Lyndon Code Internal medicine

## 2023-05-13 NOTE — Telephone Encounter (Signed)
Awaiting 05/13/23 office notes for Dermatology referral-Toni

## 2023-05-19 ENCOUNTER — Other Ambulatory Visit: Payer: Self-pay | Admitting: Physician Assistant

## 2023-05-19 ENCOUNTER — Other Ambulatory Visit: Payer: Self-pay | Admitting: Internal Medicine

## 2023-05-19 DIAGNOSIS — E038 Other specified hypothyroidism: Secondary | ICD-10-CM

## 2023-05-19 DIAGNOSIS — R7301 Impaired fasting glucose: Secondary | ICD-10-CM

## 2023-06-23 ENCOUNTER — Telehealth: Payer: Self-pay

## 2023-06-23 NOTE — Telephone Encounter (Addendum)
Spoke with pt will think about  cologuard due to she is will call us back

## 2023-08-13 ENCOUNTER — Other Ambulatory Visit: Payer: Self-pay | Admitting: Physician Assistant

## 2023-08-13 DIAGNOSIS — I10 Essential (primary) hypertension: Secondary | ICD-10-CM

## 2023-09-13 ENCOUNTER — Ambulatory Visit: Payer: PPO | Admitting: Physician Assistant

## 2023-09-21 ENCOUNTER — Ambulatory Visit (INDEPENDENT_AMBULATORY_CARE_PROVIDER_SITE_OTHER): Payer: PPO | Admitting: Nurse Practitioner

## 2023-09-21 ENCOUNTER — Encounter: Payer: Self-pay | Admitting: Nurse Practitioner

## 2023-09-21 ENCOUNTER — Telehealth: Payer: Self-pay

## 2023-09-21 VITALS — Resp 16 | Ht 61.5 in | Wt 224.0 lb

## 2023-09-21 DIAGNOSIS — J069 Acute upper respiratory infection, unspecified: Secondary | ICD-10-CM

## 2023-09-21 MED ORDER — AZITHROMYCIN 250 MG PO TABS: ORAL_TABLET | ORAL | 0 refills | Status: AC

## 2023-09-21 MED ORDER — PREDNISONE 10 MG (21) PO TBPK: ORAL_TABLET | ORAL | 0 refills | Status: DC

## 2023-09-21 NOTE — Telephone Encounter (Signed)
Patient called stating she is SOB a little bit like she can get up and do stuff but just not like she use to and sweating, patient is advised to do Covid test and give office a call back with results.

## 2023-09-21 NOTE — Progress Notes (Signed)
[facility] [address] [place], [place] [address]  Internal MEDICINE  Telephone Visit  Patient Name: Savannah Franco  [id number]  [id number]  Date of Service: 09/21/2023  I connected with the patient at 1145 by telephone and verified the patients identity using two identifiers.   I discussed the limitations, risks, security and privacy concerns of performing an evaluation and management service by telephone and the availability of in person appointments. I also discussed with the patient that there may be a patient responsible charge related to the service.  The patient expressed understanding and agrees to proceed.    Chief Complaint  Patient presents with   Telephone Screen    Sinusitis- SOB since Friday, cough, rattle in chest, covid test negative    Telephone Assessment    HPI Savannah Franco presents for a telephone visit for symptoms of sinusitis.  Onset of symptoms was Friday. Reports SOB, cough, chest congestion and headaches, and some wheezing Covid test was negative   Current Medication: Outpatient Encounter Medications as of 09/21/2023  Medication Sig   aspirin 81 MG EC tablet Take 81 mg by mouth daily. Swallow whole.   atorvastatin (LIPITOR) 40 MG tablet TAKE 1 TABLET(40 MG) BY MOUTH DAILY   azithromycin (ZITHROMAX) 250 MG tablet Take 2 tablets on day 1, then 1 tablet daily on days 2 through 5   B Complex Vitamins (VITAMIN B COMPLEX PO) Take by mouth.   cholecalciferol (VITAMIN D) 1000 units tablet Take by mouth daily.   diclofenac sodium (VOLTAREN) 1 % GEL Apply 4 g topically 4 (four) times daily.   levothyroxine (SYNTHROID) 75 MCG tablet TAKE 1 TABLET BY MOUTH DAILY BEFORE BREAKFAST   losartan-hydrochlorothiazide (HYZAAR) 100-25 MG tablet TAKE 1 TABLET BY MOUTH DAILY   meclizine (ANTIVERT) 12.5 MG tablet Take 1 tablet (12.5 mg total) by mouth 2 (two) times daily as needed for dizziness.   predniSONE (STERAPRED UNI-PAK 21 TAB) 10 MG (21) TBPK tablet Use as  directed for 6 days   Tdap (ADACEL) 12-30-13.5 LF-MCG/0.5 injection Inject 0.5 mLs into the muscle once.   No facility-administered encounter medications on file as of 09/21/2023.    Surgical History: Past Surgical History:  Procedure Laterality Date   BREAST CYST ASPIRATION Right    NEG   BREAST EXCISIONAL BIOPSY Left 2002   NEG   CATARACT EXTRACTION W/PHACO Right 10/12/2019   Procedure: CATARACT EXTRACTION PHACO AND INTRAOCULAR LENS PLACEMENT (IOC) RIGHT VISION BLUE 10.81  00:58.8  17.9%;  Surgeon: Elliot Cousin, MD;  Location: [facility];  Service: Ophthalmology;  Laterality: Right;   CATARACT EXTRACTION W/PHACO Left 07/29/2021   Procedure: CATARACT EXTRACTION PHACO AND INTRAOCULAR LENS PLACEMENT (IOC) LEFT 8.19 00:49.5;  Surgeon: Galen Manila, MD;  Location: [facility];  Service: Ophthalmology;  Laterality: Left;   DENTAL SURGERY      Medical History: Past Medical History:  Diagnosis Date   Arthritis    right knee   Hyperlipidemia    Hypertension    Hypothyroidism    Vertigo    "years ago"   Wears dentures    full upper and lower    Family History: Family History  Problem Relation Age of Onset   Multiple myeloma Mother    Stroke Father    Hypertension Father    Breast cancer Neg Hx     Social History   Socioeconomic History   Marital status: Divorced    Spouse name: Not on file   Number of children: Not on  file   Years of education: Not on file   Highest education level: Not on file  Occupational History   Not on file  Tobacco Use   Smoking status: Former    Current packs/day: 0.00    Types: Cigarettes    Quit date: 2005    Years since quitting: 20.0   Smokeless tobacco: Never  Vaping Use   Vaping status: Never Used  Substance and Sexual Activity   Alcohol use: Not Currently    Alcohol/week: 1.0 standard drink of alcohol    Types: 1 Cans of beer per week    Comment: social (1 beer/week)   Drug use: No   Sexual activity: Not on  file  Other Topics Concern   Not on file  Social History Narrative   Not on file   Social Drivers of Health   Financial Resource Strain: Not on file  Food Insecurity: Not on file  Transportation Needs: Not on file  Physical Activity: Not on file  Stress: Not on file  Social Connections: Not on file  Intimate Partner Violence: Not on file      Review of Systems  Constitutional:  Negative for chills, fatigue and fever.  HENT:  Positive for congestion, postnasal drip, rhinorrhea, sinus pressure, sinus pain, sneezing and sore throat. Negative for ear pain.   Respiratory:  Positive for cough, chest tightness and shortness of breath. Negative for wheezing.   Cardiovascular: Negative.  Negative for chest pain and palpitations.  Musculoskeletal:  Negative for myalgias.  Neurological:  Positive for headaches.    Vital Signs: Resp 16   Ht 5' 1.5" (1.562 m)   Wt 224 lb (101.6 kg)   BMI 41.64 kg/m    Observation/Objective: She is alert and oriented. No acute distress noted.    Assessment/Plan: 1. Upper respiratory tract infection, unspecified type (Primary) Zpak and prednisone taper prescribed. Take until gone  - azithromycin (ZITHROMAX) 250 MG tablet; Take 2 tablets on day 1, then 1 tablet daily on days 2 through 5  Dispense: 6 tablet; Refill: 0 - predniSONE (STERAPRED UNI-PAK 21 TAB) 10 MG (21) TBPK tablet; Use as directed for 6 days  Dispense: 21 tablet; Refill: 0   General Counseling: Savannah Franco verbalizes understanding of the findings of today's phone visit and agrees with plan of treatment. I have discussed any further diagnostic evaluation that may be needed or ordered today. We also reviewed her medications today. she has been encouraged to call the office with any questions or concerns that should arise related to todays visit.  Return if symptoms worsen or fail to improve.   No orders of the defined types were placed in this encounter.   Meds ordered this encounter   Medications   azithromycin (ZITHROMAX) 250 MG tablet    Sig: Take 2 tablets on day 1, then 1 tablet daily on days 2 through 5    Dispense:  6 tablet    Refill:  0   predniSONE (STERAPRED UNI-PAK 21 TAB) 10 MG (21) TBPK tablet    Sig: Use as directed for 6 days    Dispense:  21 tablet    Refill:  0    Time spent:10 Minutes Time spent with patient included reviewing progress notes, labs, imaging studies, and discussing plan for follow up.  [place] Controlled Substance Database was reviewed by me for overdose risk score (ORS) if appropriate.  This patient was seen by Sallyanne Kuster, FNP-C in collaboration with Dr. Beverely Risen as a part of collaborative  care agreement.  Illene Sweeting R. Tedd Sias, MSN, FNP-C Internal medicine

## 2023-11-13 ENCOUNTER — Other Ambulatory Visit: Payer: Self-pay | Admitting: Internal Medicine

## 2023-11-13 ENCOUNTER — Other Ambulatory Visit: Payer: Self-pay | Admitting: Physician Assistant

## 2023-11-13 DIAGNOSIS — E063 Autoimmune thyroiditis: Secondary | ICD-10-CM

## 2023-11-13 DIAGNOSIS — E782 Mixed hyperlipidemia: Secondary | ICD-10-CM

## 2023-11-18 ENCOUNTER — Other Ambulatory Visit (HOSPITAL_COMMUNITY): Payer: Self-pay

## 2023-11-19 ENCOUNTER — Other Ambulatory Visit (HOSPITAL_COMMUNITY): Payer: Self-pay

## 2023-11-19 MED FILL — Levothyroxine Sodium Tab 75 MCG: ORAL | 90 days supply | Qty: 90 | Fill #0 | Status: CN

## 2023-11-19 MED FILL — Levothyroxine Sodium Tab 75 MCG: ORAL | 90 days supply | Qty: 90 | Fill #0 | Status: AC

## 2023-11-19 MED FILL — Atorvastatin Calcium Tab 40 MG (Base Equivalent): ORAL | 90 days supply | Qty: 90 | Fill #0 | Status: AC

## 2023-11-19 MED FILL — Losartan Potassium & Hydrochlorothiazide Tab 100-25 MG: ORAL | 90 days supply | Qty: 90 | Fill #0 | Status: AC

## 2023-11-19 MED FILL — Losartan Potassium & Hydrochlorothiazide Tab 100-25 MG: ORAL | 90 days supply | Qty: 90 | Fill #0 | Status: CN

## 2023-11-19 MED FILL — Atorvastatin Calcium Tab 40 MG (Base Equivalent): ORAL | 90 days supply | Qty: 90 | Fill #0 | Status: CN

## 2023-11-22 ENCOUNTER — Other Ambulatory Visit (HOSPITAL_COMMUNITY): Payer: Self-pay

## 2023-11-25 ENCOUNTER — Ambulatory Visit (INDEPENDENT_AMBULATORY_CARE_PROVIDER_SITE_OTHER): Admitting: Physician Assistant

## 2023-11-25 ENCOUNTER — Encounter: Payer: Self-pay | Admitting: Physician Assistant

## 2023-11-25 ENCOUNTER — Other Ambulatory Visit (HOSPITAL_COMMUNITY): Payer: Self-pay

## 2023-11-25 ENCOUNTER — Telehealth: Payer: Self-pay | Admitting: Physician Assistant

## 2023-11-25 ENCOUNTER — Other Ambulatory Visit: Payer: Self-pay | Admitting: Physician Assistant

## 2023-11-25 VITALS — BP 138/78 | HR 90 | Temp 98.3°F | Resp 16 | Ht 61.5 in | Wt 233.8 lb

## 2023-11-25 DIAGNOSIS — I10 Essential (primary) hypertension: Secondary | ICD-10-CM | POA: Diagnosis not present

## 2023-11-25 DIAGNOSIS — R5383 Other fatigue: Secondary | ICD-10-CM

## 2023-11-25 DIAGNOSIS — E039 Hypothyroidism, unspecified: Secondary | ICD-10-CM

## 2023-11-25 DIAGNOSIS — M25561 Pain in right knee: Secondary | ICD-10-CM

## 2023-11-25 DIAGNOSIS — E782 Mixed hyperlipidemia: Secondary | ICD-10-CM

## 2023-11-25 DIAGNOSIS — M25473 Effusion, unspecified ankle: Secondary | ICD-10-CM

## 2023-11-25 DIAGNOSIS — E538 Deficiency of other specified B group vitamins: Secondary | ICD-10-CM | POA: Diagnosis not present

## 2023-11-25 DIAGNOSIS — E559 Vitamin D deficiency, unspecified: Secondary | ICD-10-CM | POA: Diagnosis not present

## 2023-11-25 DIAGNOSIS — R7303 Prediabetes: Secondary | ICD-10-CM | POA: Diagnosis not present

## 2023-11-25 MED ORDER — FUROSEMIDE 20 MG PO TABS: ORAL_TABLET | ORAL | 1 refills | Status: DC

## 2023-11-25 MED ORDER — FUROSEMIDE 20 MG PO TABS
ORAL_TABLET | ORAL | 1 refills | Status: DC
  Filled 2023-11-25: qty 30, fill #0

## 2023-11-25 NOTE — Progress Notes (Signed)
 [facility] [address] [place], [place] [address]  Internal MEDICINE  Office Visit Note  Patient Name: Savannah Franco  [id number]  [id number]  Date of Service: 11/25/2023  Chief Complaint  Patient presents with   Follow-up   Hypertension   Hyperlipidemia   Quality Metric Gaps    Shingles Vaccine and Colonoscopy    HPI Pt is here for routine follow up -Had an issue with pharmacy for med costs and had to change to mail pharmacy per insurance recommendation -recently having ankle swelling, bilaterally. May be related to knee pain recently and change in activity. States she got down on her right knee to bend down to do something at home and next day was very painful. Does have OA in that knee, but it hadnt been bothering her like this in years. Has improved some, but still painful. Discussed ortho referral -had a traumatic instance recently where a neighbors dog went after one of her horses and then a week later one of her other horses passed away. This has been hard on her and she is working through it. Declines any medication at this time, but will let office know if anything changes  Current Medication: Outpatient Encounter Medications as of 11/25/2023  Medication Sig   aspirin 81 MG EC tablet Take 81 mg by mouth daily. Swallow whole.   atorvastatin (LIPITOR) 40 MG tablet Take 1 tablet (40 mg total) by mouth daily.   B Complex Vitamins (VITAMIN B COMPLEX PO) Take by mouth.   cholecalciferol (VITAMIN D) 1000 units tablet Take by mouth daily.   diclofenac sodium (VOLTAREN) 1 % GEL Apply 4 g topically 4 (four) times daily.   levothyroxine (SYNTHROID) 75 MCG tablet TAKE 1 TABLET BY MOUTH DAILY BEFORE BREAKFAST   losartan-hydrochlorothiazide (HYZAAR) 100-25 MG tablet TAKE 1 TABLET BY MOUTH DAILY   meclizine (ANTIVERT) 12.5 MG tablet Take 1 tablet (12.5 mg total) by mouth 2 (two) times daily as needed for dizziness.   Tdap (ADACEL) 12-30-13.5 LF-MCG/0.5 injection Inject 0.5  mLs into the muscle once.   [DISCONTINUED] furosemide (LASIX) 20 MG tablet Take 1 tablet by mouth daily as needed for ankle swelling   [DISCONTINUED] predniSONE (STERAPRED UNI-PAK 21 TAB) 10 MG (21) TBPK tablet Use as directed for 6 days   furosemide (LASIX) 20 MG tablet Take 1 tablet by mouth daily as needed for ankle swelling   No facility-administered encounter medications on file as of 11/25/2023.    Surgical History: Past Surgical History:  Procedure Laterality Date   BREAST CYST ASPIRATION Right    NEG   BREAST EXCISIONAL BIOPSY Left 2002   NEG   CATARACT EXTRACTION W/PHACO Right 10/12/2019   Procedure: CATARACT EXTRACTION PHACO AND INTRAOCULAR LENS PLACEMENT (IOC) RIGHT VISION BLUE 10.81  00:58.8  17.9%;  Surgeon: Elliot Cousin, MD;  Location: [facility];  Service: Ophthalmology;  Laterality: Right;   CATARACT EXTRACTION W/PHACO Left 07/29/2021   Procedure: CATARACT EXTRACTION PHACO AND INTRAOCULAR LENS PLACEMENT (IOC) LEFT 8.19 00:49.5;  Surgeon: Galen Manila, MD;  Location: [facility];  Service: Ophthalmology;  Laterality: Left;   DENTAL SURGERY      Medical History: Past Medical History:  Diagnosis Date   Arthritis    right knee   Hyperlipidemia    Hypertension    Hypothyroidism    Vertigo    "years ago"   Wears dentures    full upper and lower    Family History: Family History  Problem Relation Age of  Onset   Multiple myeloma Mother    Stroke Father    Hypertension Father    Breast cancer Neg Hx     Social History   Socioeconomic History   Marital status: Divorced    Spouse name: Not on file   Number of children: Not on file   Years of education: Not on file   Highest education level: Not on file  Occupational History   Not on file  Tobacco Use   Smoking status: Former    Current packs/day: 0.00    Types: Cigarettes    Quit date: 2005    Years since quitting: 20.2   Smokeless tobacco: Never  Vaping Use   Vaping status:  Never Used  Substance and Sexual Activity   Alcohol use: Not Currently    Alcohol/week: 1.0 standard drink of alcohol    Types: 1 Cans of beer per week    Comment: social (1 beer/week)   Drug use: No   Sexual activity: Not on file  Other Topics Concern   Not on file  Social History Narrative   Not on file   Social Drivers of Health   Financial Resource Strain: Not on file  Food Insecurity: Not on file  Transportation Needs: Not on file  Physical Activity: Not on file  Stress: Not on file  Social Connections: Not on file  Intimate Partner Violence: Not on file      Review of Systems  Constitutional:  Negative for chills, fatigue and unexpected weight change.  HENT:  Negative for congestion, postnasal drip, rhinorrhea, sneezing and sore throat.   Eyes:  Negative for redness.  Respiratory:  Negative for cough, chest tightness, shortness of breath and wheezing.   Cardiovascular:  Positive for leg swelling. Negative for chest pain and palpitations.  Gastrointestinal:  Negative for abdominal pain, constipation, diarrhea, nausea and vomiting.  Genitourinary:  Negative for dysuria and frequency.  Musculoskeletal:  Positive for arthralgias. Negative for back pain, joint swelling and neck pain.  Skin:  Negative for rash.  Neurological: Negative.  Negative for tremors and numbness.  Hematological:  Negative for adenopathy. Does not bruise/bleed easily.  Psychiatric/Behavioral:  Positive for dysphoric mood. Negative for sleep disturbance and suicidal ideas. Behavioral problem: Depression.The patient is nervous/anxious.     Vital Signs: BP 138/78 Comment: 140/80  Pulse 90   Temp 98.3 F (36.8 C)   Resp 16   Ht 5' 1.5" (1.562 m)   Wt 233 lb 12.8 oz (106.1 kg)   SpO2 95%   BMI 43.46 kg/m    Physical Exam Vitals and nursing note reviewed.  Constitutional:      General: She is not in acute distress.    Appearance: She is well-developed. She is obese. She is not diaphoretic.   HENT:     Head: Normocephalic and atraumatic.  Eyes:     Pupils: Pupils are equal, round, and reactive to light.  Neck:     Thyroid: No thyromegaly.     Vascular: No JVD.     Trachea: No tracheal deviation.  Cardiovascular:     Rate and Rhythm: Normal rate and regular rhythm.     Heart sounds: Normal heart sounds. No murmur heard.    No friction rub. No gallop.  Pulmonary:     Effort: Pulmonary effort is normal. No respiratory distress.     Breath sounds: No wheezing or rales.  Chest:     Chest wall: No tenderness.  Breasts:    Right: Normal.  No mass.     Left: Normal. No mass.  Musculoskeletal:        General: Normal range of motion.     Cervical back: Normal range of motion and neck supple.     Right lower leg: Edema present.     Left lower leg: Edema present.  Lymphadenopathy:     Cervical: No cervical adenopathy.  Skin:    General: Skin is warm and dry.  Neurological:     Mental Status: She is alert and oriented to person, place, and time.  Psychiatric:        Thought Content: Thought content normal.        Judgment: Judgment normal.     Comments: On verge of tears talking about her horses        Assessment/Plan: 1. Ankle swelling, unspecified laterality (Primary) May be due to change in activity. Will elevated legs and try compression stockings. May use lasix prn. Advised will need to check labs sooner if taking regularly to monitor potassium - furosemide (LASIX) 20 MG tablet; Take 1 tablet by mouth daily as needed for ankle swelling  Dispense: 30 tablet; Refill: 1  2. Right knee pain, unspecified chronicity Acute on chronic right knee pain, will refer to ortho for further evaluation - AMB referral to orthopedics  3. Essential hypertension Continue current medication  4. Acquired hypothyroidism Will update labs - TSH + free T4  5. Prediabetes - Hgb A1C w/o eAG  6. Vitamin D deficiency - VITAMIN D 25 Hydroxy (Vit-D Deficiency, Fractures)  7. Mixed  hyperlipidemia Continue lipitor and will update labs - Lipid Panel With LDL/HDL Ratio  8. B12 deficiency - B12 and Folate Panel  9. Other fatigue - CBC w/Diff/Platelet - Comprehensive metabolic panel with GFR - TSH + free T4 - Hgb A1C w/o eAG - Lipid Panel With LDL/HDL Ratio - B12 and Folate Panel - VITAMIN D 25 Hydroxy (Vit-D Deficiency, Fractures)   General Counseling: Malachi Bonds verbalizes understanding of the findings of todays visit and agrees with plan of treatment. I have discussed any further diagnostic evaluation that may be needed or ordered today. We also reviewed her medications today. she has been encouraged to call the office with any questions or concerns that should arise related to todays visit.    Orders Placed This Encounter  Procedures   CBC w/Diff/Platelet   Comprehensive metabolic panel with GFR   TSH + free T4   Hgb A1C w/o eAG   Lipid Panel With LDL/HDL Ratio   B12 and Folate Panel   VITAMIN D 25 Hydroxy (Vit-D Deficiency, Fractures)   AMB referral to orthopedics    Meds ordered this encounter  Medications   DISCONTD: furosemide (LASIX) 20 MG tablet    Sig: Take 1 tablet by mouth daily as needed for ankle swelling    Dispense:  30 tablet    Refill:  1   furosemide (LASIX) 20 MG tablet    Sig: Take 1 tablet by mouth daily as needed for ankle swelling    Dispense:  30 tablet    Refill:  1    This patient was seen by Lynn Ito, PA-C in collaboration with Dr. Beverely Risen as a part of collaborative care agreement.   Total time spent:30 Minutes Time spent includes review of chart, medications, test results, and follow up plan with the patient.      Dr Lyndon Code Internal medicine

## 2023-11-25 NOTE — Telephone Encounter (Signed)
Orthopedic referral sent via [facility] to [facility]. Notified patient. Gave pt telephone # [phone]

## 2023-12-01 ENCOUNTER — Telehealth: Payer: Self-pay | Admitting: Physician Assistant

## 2023-12-01 NOTE — Telephone Encounter (Signed)
 Per Marquis Buggy, referral has been closed due to no return call from patient -Sheralyn Boatman

## 2023-12-24 DIAGNOSIS — R7303 Prediabetes: Secondary | ICD-10-CM | POA: Diagnosis not present

## 2023-12-24 DIAGNOSIS — E538 Deficiency of other specified B group vitamins: Secondary | ICD-10-CM | POA: Diagnosis not present

## 2023-12-24 DIAGNOSIS — E559 Vitamin D deficiency, unspecified: Secondary | ICD-10-CM | POA: Diagnosis not present

## 2023-12-24 DIAGNOSIS — E782 Mixed hyperlipidemia: Secondary | ICD-10-CM | POA: Diagnosis not present

## 2023-12-24 DIAGNOSIS — R5383 Other fatigue: Secondary | ICD-10-CM | POA: Diagnosis not present

## 2023-12-24 DIAGNOSIS — E039 Hypothyroidism, unspecified: Secondary | ICD-10-CM | POA: Diagnosis not present

## 2023-12-25 LAB — COMPREHENSIVE METABOLIC PANEL WITH GFR
ALT: 19 IU/L (ref 0–32)
AST: 18 IU/L (ref 0–40)
Albumin: 4.4 g/dL (ref 3.8–4.8)
Alkaline Phosphatase: 75 IU/L (ref 44–121)
BUN/Creatinine Ratio: 22 (ref 12–28)
BUN: 15 mg/dL (ref 8–27)
Bilirubin Total: 0.4 mg/dL (ref 0.0–1.2)
CO2: 29 mmol/L (ref 20–29)
Calcium: 9.9 mg/dL (ref 8.7–10.3)
Chloride: 99 mmol/L (ref 96–106)
Creatinine, Ser: 0.69 mg/dL (ref 0.57–1.00)
Globulin, Total: 2.6 g/dL (ref 1.5–4.5)
Glucose: 101 mg/dL — ABNORMAL HIGH (ref 70–99)
Potassium: 4.4 mmol/L (ref 3.5–5.2)
Sodium: 142 mmol/L (ref 134–144)
Total Protein: 7 g/dL (ref 6.0–8.5)
eGFR: 92 mL/min/{1.73_m2} (ref >59)

## 2023-12-25 LAB — B12 AND FOLATE PANEL
Folate: >20 ng/mL (ref >3.0)
Vitamin B-12: 511 pg/mL (ref 232–1245)

## 2023-12-25 LAB — CBC WITH DIFFERENTIAL/PLATELET
Basophils Absolute: 0 10*3/uL (ref 0.0–0.2)
Basos: 1 %
EOS (ABSOLUTE): 0.3 10*3/uL (ref 0.0–0.4)
Eos: 5 %
Hematocrit: 43.7 % (ref 34.0–46.6)
Hemoglobin: 14.5 g/dL (ref 11.1–15.9)
Immature Grans (Abs): 0 10*3/uL (ref 0.0–0.1)
Immature Granulocytes: 0 %
Lymphocytes Absolute: 1.5 10*3/uL (ref 0.7–3.1)
Lymphs: 30 %
MCH: 31.7 pg (ref 26.6–33.0)
MCHC: 33.2 g/dL (ref 31.5–35.7)
MCV: 95 fL (ref 79–97)
Monocytes Absolute: 0.5 10*3/uL (ref 0.1–0.9)
Monocytes: 9 %
Neutrophils Absolute: 2.8 10*3/uL (ref 1.4–7.0)
Neutrophils: 55 %
Platelets: 277 10*3/uL (ref 150–450)
RBC: 4.58 x10E6/uL (ref 3.77–5.28)
RDW: 12.6 % (ref 11.7–15.4)
WBC: 5.1 10*3/uL (ref 3.4–10.8)

## 2023-12-25 LAB — HGB A1C W/O EAG: Hgb A1c MFr Bld: 5.8 % — ABNORMAL HIGH (ref 4.8–5.6)

## 2023-12-25 LAB — LIPID PANEL WITH LDL/HDL RATIO
Cholesterol, Total: 149 mg/dL (ref 100–199)
HDL: 47 mg/dL (ref >39)
LDL Chol Calc (NIH): 82 mg/dL (ref 0–99)
LDL/HDL Ratio: 1.7 ratio (ref 0.0–3.2)
Triglycerides: 112 mg/dL (ref 0–149)
VLDL Cholesterol Cal: 20 mg/dL (ref 5–40)

## 2023-12-25 LAB — TSH+FREE T4
Free T4: 1.24 ng/dL (ref 0.82–1.77)
TSH: 2.16 u[IU]/mL (ref 0.450–4.500)

## 2023-12-25 LAB — VITAMIN D 25 HYDROXY (VIT D DEFICIENCY, FRACTURES): Vit D, 25-Hydroxy: 35.2 ng/mL (ref 30.0–100.0)

## 2024-01-10 ENCOUNTER — Ambulatory Visit (INDEPENDENT_AMBULATORY_CARE_PROVIDER_SITE_OTHER): Payer: PPO | Admitting: Physician Assistant

## 2024-01-10 ENCOUNTER — Encounter: Payer: Self-pay | Admitting: Physician Assistant

## 2024-01-10 VITALS — BP 130/80 | HR 80 | Temp 98.5°F | Resp 16 | Ht 61.5 in | Wt 229.4 lb

## 2024-01-10 DIAGNOSIS — R7303 Prediabetes: Secondary | ICD-10-CM | POA: Diagnosis not present

## 2024-01-10 DIAGNOSIS — Z1231 Encounter for screening mammogram for malignant neoplasm of breast: Secondary | ICD-10-CM

## 2024-01-10 DIAGNOSIS — Z0000 Encounter for general adult medical examination without abnormal findings: Secondary | ICD-10-CM

## 2024-01-10 DIAGNOSIS — I10 Essential (primary) hypertension: Secondary | ICD-10-CM | POA: Diagnosis not present

## 2024-01-10 NOTE — Progress Notes (Signed)
 [facility] [address] [place], [place] [address]  Internal MEDICINE  Office Visit Note  Patient Name: Savannah Franco  [id number]  [id number]  Date of Service: 01/10/2024  Chief Complaint  Patient presents with   Hyperlipidemia   Hypertension   Medicare Wellness    HPI Savannah Franco presents for an annual well visit Well-appearing 73 y.o. female  Routine CRC screening: due for colonoscopy as of Dec 2024; mom had Colon CA and great Aunt. She will think about it and encouraged need for repeat especially given first degree relative Routine mammogram: overdue, will reorder Labs: reviewed, A1c 5.8--stable, vit D borderline and will continue supplement -Got compression sleeves for ankle swelling rather than see ortho. Tolerating lasix  and actually feels better on it. Taking daily. Labs stable on this Other concerns: States grandson dx with covid yesterday. Had exposure to him earlier that day. She has not developed any symptoms. -Had a scare when walking horses, saw [place] a foot from her. She reacted and so did the horse and got pulled over, but no injury and did not hit head. No bite. Just scared her.     01/10/2024   10:54 AM 01/08/2023   11:30 AM 12/15/2021    1:40 PM  MMSE - Mini Mental State Exam  Orientation to time 5 5 5   Orientation to Place 5 5 5   Registration 3 3 3   Attention/ Calculation 5 5 5   Recall 3 3 3   Language- name 2 objects 2 2 2   Language- repeat 1 1 1   Language- follow 3 step command 3 3 3   Language- read & follow direction 1 1 1   Write a sentence 1 1 1   Copy design 1 1 1   Total score 30 30 30     Functional Status Survey: Is the patient deaf or have difficulty hearing?: No Does the patient have difficulty seeing, even when wearing glasses/contacts?: No Does the patient have difficulty concentrating, remembering, or making decisions?: No Does the patient have difficulty walking or climbing stairs?: No Does the patient have difficulty  dressing or bathing?: No Does the patient have difficulty doing errands alone such as visiting a doctor's office or shopping?: No     09/18/2022    9:18 AM 01/08/2023   11:30 AM 05/13/2023   11:22 AM 11/25/2023   11:10 AM 01/10/2024   10:52 AM  Fall Risk  Falls in the past year? 1 0 0 0 0  Was there an injury with Fall? 0    0  Fall Risk Category Calculator 1    0  Patient at Risk for Falls Due to     No Fall Risks  Fall risk Follow up     Falls evaluation completed       01/10/2024   10:53 AM  Depression screen PHQ 2/9  Decreased Interest 0  Down, Depressed, Hopeless 0  PHQ - 2 Score 0        No data to display            Current Medication: Outpatient Encounter Medications as of 01/10/2024  Medication Sig   aspirin 81 MG EC tablet Take 81 mg by mouth daily. Swallow whole.   atorvastatin  (LIPITOR) 40 MG tablet Take 1 tablet (40 mg total) by mouth daily.   B Complex Vitamins (VITAMIN B COMPLEX PO) Take by mouth.   cholecalciferol (VITAMIN D ) 1000 units tablet Take by mouth daily.   diclofenac  sodium (VOLTAREN ) 1 % GEL Apply 4 g topically  4 (four) times daily.   furosemide  (LASIX ) 20 MG tablet Take 1 tablet by mouth daily as needed for ankle swelling   levothyroxine  (SYNTHROID ) 75 MCG tablet TAKE 1 TABLET BY MOUTH DAILY BEFORE BREAKFAST   losartan -hydrochlorothiazide  (HYZAAR ) 100-25 MG tablet TAKE 1 TABLET BY MOUTH DAILY   meclizine  (ANTIVERT ) 12.5 MG tablet Take 1 tablet (12.5 mg total) by mouth 2 (two) times daily as needed for dizziness.   Tdap (ADACEL) 12-30-13.5 LF-MCG/0.5 injection Inject 0.5 mLs into the muscle once.   No facility-administered encounter medications on file as of 01/10/2024.    Surgical History: Past Surgical History:  Procedure Laterality Date   BREAST CYST ASPIRATION Right    NEG   BREAST EXCISIONAL BIOPSY Left 2002   NEG   CATARACT EXTRACTION W/PHACO Right 10/12/2019   Procedure: CATARACT EXTRACTION PHACO AND INTRAOCULAR LENS PLACEMENT (IOC)  RIGHT VISION BLUE 10.81  00:58.8  17.9%;  Surgeon: Ola Berger, MD;  Location: [facility];  Service: Ophthalmology;  Laterality: Right;   CATARACT EXTRACTION W/PHACO Left 07/29/2021   Procedure: CATARACT EXTRACTION PHACO AND INTRAOCULAR LENS PLACEMENT (IOC) LEFT 8.19 00:49.5;  Surgeon: Clair Crews, MD;  Location: [facility];  Service: Ophthalmology;  Laterality: Left;   DENTAL SURGERY      Medical History: Past Medical History:  Diagnosis Date   Arthritis    right knee   Hyperlipidemia    Hypertension    Hypothyroidism    Vertigo    "years ago"   Wears dentures    full upper and lower    Family History: Family History  Problem Relation Age of Onset   Multiple myeloma Mother    Stroke Father    Hypertension Father    Breast cancer Neg Hx     Social History   Socioeconomic History   Marital status: Divorced    Spouse name: Not on file   Number of children: Not on file   Years of education: Not on file   Highest education level: Not on file  Occupational History   Not on file  Tobacco Use   Smoking status: Former    Current packs/day: 0.00    Types: Cigarettes    Quit date: 2005    Years since quitting: 20.3   Smokeless tobacco: Never  Vaping Use   Vaping status: Never Used  Substance and Sexual Activity   Alcohol use: Not Currently    Alcohol/week: 1.0 standard drink of alcohol    Types: 1 Cans of beer per week    Comment: social (1 beer/week)   Drug use: No   Sexual activity: Not on file  Other Topics Concern   Not on file  Social History Narrative   Not on file   Social Drivers of Health   Financial Resource Strain: Not on file  Food Insecurity: Not on file  Transportation Needs: Not on file  Physical Activity: Not on file  Stress: Not on file  Social Connections: Not on file  Intimate Partner Violence: Not on file      Review of Systems  Constitutional:  Negative for chills, fatigue and unexpected weight change.   HENT:  Negative for congestion, postnasal drip, rhinorrhea, sneezing and sore throat.   Eyes:  Negative for redness.  Respiratory:  Negative for cough, chest tightness, shortness of breath and wheezing.   Cardiovascular:  Negative for chest pain and palpitations.  Gastrointestinal:  Negative for abdominal pain, constipation, diarrhea, nausea and vomiting.  Genitourinary:  Negative for dysuria  and frequency.  Musculoskeletal:  Positive for arthralgias. Negative for back pain, joint swelling and neck pain.  Skin:  Negative for rash.  Neurological: Negative.  Negative for tremors and numbness.  Hematological:  Negative for adenopathy. Does not bruise/bleed easily.  Psychiatric/Behavioral:  Negative for sleep disturbance and suicidal ideas. Behavioral problem: Depression.The patient is nervous/anxious.     Vital Signs: BP 130/80 Comment: 148/84  Pulse 80   Temp 98.5 F (36.9 C)   Resp 16   Ht 5' 1.5" (1.562 m)   Wt 229 lb 6.4 oz (104.1 kg)   SpO2 98%   BMI 42.64 kg/m    Physical Exam Vitals and nursing note reviewed.  Constitutional:      General: She is not in acute distress.    Appearance: She is well-developed. She is obese. She is not diaphoretic.  HENT:     Head: Normocephalic and atraumatic.  Eyes:     Extraocular Movements: Extraocular movements intact.  Neck:     Thyroid : No thyromegaly.     Vascular: No JVD.     Trachea: No tracheal deviation.  Cardiovascular:     Rate and Rhythm: Normal rate and regular rhythm.     Heart sounds: Normal heart sounds. No murmur heard.    No friction rub. No gallop.  Pulmonary:     Effort: Pulmonary effort is normal. No respiratory distress.     Breath sounds: No wheezing or rales.  Chest:     Chest wall: No tenderness.  Breasts:    Right: Normal. No mass.     Left: Normal. No mass.  Skin:    General: Skin is warm and dry.  Neurological:     Mental Status: She is alert and oriented to person, place, and time.  Psychiatric:         Behavior: Behavior normal.        Thought Content: Thought content normal.        Judgment: Judgment normal.        Assessment/Plan: 1. Encounter for annual wellness exam in Medicare patient (Primary) AWV performed, labs reviewed. Due for mammogram and colonoscopy--declines colonoscopy at this time and will think about it  2. Visit for screening mammogram - MM 3D SCREENING MAMMOGRAM BILATERAL BREAST; Future  3. Essential hypertension Stable, continue current medication  4. Prediabetes Stable, continue to work on diet and exercise    General Counseling: lucindia lathe understanding of the findings of todays visit and agrees with plan of treatment. I have discussed any further diagnostic evaluation that may be needed or ordered today. We also reviewed her medications today. she has been encouraged to call the office with any questions or concerns that should arise related to todays visit.    Orders Placed This Encounter  Procedures   MM 3D SCREENING MAMMOGRAM BILATERAL BREAST    No orders of the defined types were placed in this encounter.   Return in about 4 months (around 05/12/2024) for general follow up, MAMMO To [place] imaging.   Total time spent:35 Minutes Time spent includes review of chart, medications, test results, and follow up plan with the patient.   [place] Controlled Substance Database was reviewed by me.  This patient was seen by Taylor Favia, PA-C in collaboration with Dr. Verneta Gone as a part of collaborative care agreement.  Taylor Favia, PA-C Internal medicine

## 2024-01-20 DIAGNOSIS — Z1231 Encounter for screening mammogram for malignant neoplasm of breast: Secondary | ICD-10-CM | POA: Diagnosis not present

## 2024-01-29 ENCOUNTER — Other Ambulatory Visit: Payer: Self-pay | Admitting: Physician Assistant

## 2024-01-29 DIAGNOSIS — M25473 Effusion, unspecified ankle: Secondary | ICD-10-CM

## 2024-02-10 ENCOUNTER — Other Ambulatory Visit: Payer: Self-pay | Admitting: Physician Assistant

## 2024-02-10 DIAGNOSIS — I10 Essential (primary) hypertension: Secondary | ICD-10-CM

## 2024-02-10 MED FILL — Atorvastatin Calcium Tab 40 MG (Base Equivalent): ORAL | 90 days supply | Qty: 90 | Fill #1 | Status: AC

## 2024-02-10 MED FILL — Levothyroxine Sodium Tab 75 MCG: ORAL | 90 days supply | Qty: 90 | Fill #1 | Status: AC

## 2024-02-11 ENCOUNTER — Other Ambulatory Visit (HOSPITAL_COMMUNITY): Payer: Self-pay

## 2024-02-11 ENCOUNTER — Other Ambulatory Visit: Payer: Self-pay

## 2024-02-11 MED ORDER — LOSARTAN POTASSIUM-HCTZ 100-25 MG PO TABS
1.0000 | ORAL_TABLET | Freq: Every day | ORAL | 1 refills | Status: DC
  Filled 2024-02-11: qty 90, 90d supply, fill #0
  Filled 2024-05-05: qty 90, 90d supply, fill #1

## 2024-04-14 DIAGNOSIS — H43813 Vitreous degeneration, bilateral: Secondary | ICD-10-CM | POA: Diagnosis not present

## 2024-04-14 DIAGNOSIS — H26492 Other secondary cataract, left eye: Secondary | ICD-10-CM | POA: Diagnosis not present

## 2024-04-14 DIAGNOSIS — H353131 Nonexudative age-related macular degeneration, bilateral, early dry stage: Secondary | ICD-10-CM | POA: Diagnosis not present

## 2024-05-05 ENCOUNTER — Other Ambulatory Visit (HOSPITAL_COMMUNITY): Payer: Self-pay

## 2024-05-05 ENCOUNTER — Telehealth: Payer: Self-pay

## 2024-05-05 MED FILL — Atorvastatin Calcium Tab 40 MG (Base Equivalent): ORAL | 90 days supply | Qty: 90 | Fill #2 | Status: AC

## 2024-05-05 NOTE — Progress Notes (Addendum)
   05/05/2024  Patient ID: Savannah Franco, female   DOB: [birth year], 73 y.o.   MRN: [id number]  This patient is appearing on a report for being at risk of failing the adherence measure for identified medications this calendar year.   Medication Adherence Summary (STAR/HEDIS Monitoring): Adherence Category: cholesterol (statin) and hypertension (ACEi/ARB)    Drug Name: Atorvastatin  40 mg  Last Fill or Sold Date:08/14/2023 Days' Supply: 90 - Patient's last LDL was 82 mg/dL as of 5/74/7974   Drug Name: Losartan - hydrochlorothiazide  100-25 mg  Last Fill or Sold Date:08/13/2023 Days' Supply: 90 - Per documented blood pressures, patient BP is at or below 130/80 mmHg most of the time.      Notes: ? Adherence data not pulled from pharmacy claims portal Dr. Annemarie. I called [facility] to confirm fill history provided in Dr. Annemarie. It is unclear where patient is filling medication - if at all.  ? Patient outreached but was unable to speak with her. HIPAA compliant voicemail message was left for patient to return call.  ? Reviewed barriers to adherence: unknown. ? Plan: MyChart message sent to patient. Upcoming PCP appointment on 05/15/2024. Will collaborate with provider.   Dorcas Solian, PharmD Clinical Pharmacist Cell: [phone]

## 2024-05-09 ENCOUNTER — Other Ambulatory Visit: Payer: Self-pay | Admitting: Internal Medicine

## 2024-05-09 ENCOUNTER — Other Ambulatory Visit: Payer: Self-pay

## 2024-05-09 ENCOUNTER — Other Ambulatory Visit (HOSPITAL_COMMUNITY): Payer: Self-pay

## 2024-05-09 DIAGNOSIS — E063 Autoimmune thyroiditis: Secondary | ICD-10-CM

## 2024-05-09 MED ORDER — LEVOTHYROXINE SODIUM 75 MCG PO TABS
75.0000 ug | ORAL_TABLET | Freq: Every day | ORAL | 1 refills | Status: AC
  Filled 2024-05-09: qty 90, 90d supply, fill #0
  Filled 2024-08-08: qty 90, 90d supply, fill #1

## 2024-05-09 NOTE — Progress Notes (Signed)
   05/09/2024  Patient ID: Savannah Franco, female   DOB: 03-12-1951, 73 y.o.   MRN: [id number]  This patient is appearing on a report for being at risk of failing the adherence measure for identified medications this calendar year. Patient was outreached for losartan  - hydrochlorothiazide  and atorvastatin  refills in which she authorized refills, but requested Levothyroxine  prescription refilled as well.   I called [facility] to get medication refilled and was told patient was out of refills. Pharmacy sent clinic a refill request.    Dorcas Solian, PharmD Clinical Pharmacist Cell: [phone]

## 2024-05-15 ENCOUNTER — Encounter: Payer: Self-pay | Admitting: Physician Assistant

## 2024-05-15 ENCOUNTER — Ambulatory Visit: Admitting: Physician Assistant

## 2024-05-15 VITALS — BP 110/80 | HR 76 | Temp 98.4°F | Resp 16 | Ht 61.5 in | Wt 233.0 lb

## 2024-05-15 DIAGNOSIS — E039 Hypothyroidism, unspecified: Secondary | ICD-10-CM

## 2024-05-15 DIAGNOSIS — I10 Essential (primary) hypertension: Secondary | ICD-10-CM

## 2024-05-15 DIAGNOSIS — E782 Mixed hyperlipidemia: Secondary | ICD-10-CM

## 2024-05-15 NOTE — Progress Notes (Signed)
 [facility] [address] [place], [place] [address]  Internal MEDICINE  Office Visit Note  Patient Name: Savannah Franco  [id number]  [id number]  Date of Service: 05/15/2024  Chief Complaint  Patient presents with   Follow-up   Hypertension   Hyperlipidemia   Medication Management    Review med adherence for Lipitor and Losartan     HPI Pt is here for routine follow up -BP at home stable, states she has been taking her hyzaar  daily -lasix  as needed -states she has been taking her atorvastatin  daily as well -has been taking some vitamins -mammogram UTD -still holding off on colonoscopy, advised on importance of updating this and to let me know as soon as possible. Her mom had colon CA  Current Medication: Outpatient Encounter Medications as of 05/15/2024  Medication Sig   aspirin 81 MG EC tablet Take 81 mg by mouth daily. Swallow whole.   atorvastatin  (LIPITOR) 40 MG tablet Take 1 tablet (40 mg total) by mouth daily.   B Complex Vitamins (VITAMIN B COMPLEX PO) Take by mouth.   cholecalciferol (VITAMIN D ) 1000 units tablet Take by mouth daily.   diclofenac  sodium (VOLTAREN ) 1 % GEL Apply 4 g topically 4 (four) times daily.   furosemide  (LASIX ) 20 MG tablet TAKE 1 TABLET BY MOUTH DAILY AS NEEDED FOR ANKLE SWELLING   levothyroxine  (SYNTHROID ) 75 MCG tablet TAKE 1 TABLET BY MOUTH DAILY BEFORE BREAKFAST   losartan -hydrochlorothiazide  (HYZAAR ) 100-25 MG tablet Take 1 tablet by mouth daily.   meclizine  (ANTIVERT ) 12.5 MG tablet Take 1 tablet (12.5 mg total) by mouth 2 (two) times daily as needed for dizziness.   Tdap (ADACEL) 12-30-13.5 LF-MCG/0.5 injection Inject 0.5 mLs into the muscle once.   No facility-administered encounter medications on file as of 05/15/2024.    Surgical History: Past Surgical History:  Procedure Laterality Date   BREAST CYST ASPIRATION Right    NEG   BREAST EXCISIONAL BIOPSY Left 2002   NEG   CATARACT EXTRACTION W/PHACO Right 10/12/2019    Procedure: CATARACT EXTRACTION PHACO AND INTRAOCULAR LENS PLACEMENT (IOC) RIGHT VISION BLUE 10.81  00:58.8  17.9%;  Surgeon: Ferol Rogue, MD;  Location: [facility];  Service: Ophthalmology;  Laterality: Right;   CATARACT EXTRACTION W/PHACO Left 07/29/2021   Procedure: CATARACT EXTRACTION PHACO AND INTRAOCULAR LENS PLACEMENT (IOC) LEFT 8.19 00:49.5;  Surgeon: Jaye Fallow, MD;  Location: [facility];  Service: Ophthalmology;  Laterality: Left;   DENTAL SURGERY      Medical History: Past Medical History:  Diagnosis Date   Arthritis    right knee   Hyperlipidemia    Hypertension    Hypothyroidism    Vertigo    years ago   Wears dentures    full upper and lower    Family History: Family History  Problem Relation Age of Onset   Multiple myeloma Mother    Stroke Father    Hypertension Father    Breast cancer Neg Hx     Social History   Socioeconomic History   Marital status: Divorced    Spouse name: Not on file   Number of children: Not on file   Years of education: Not on file   Highest education level: Not on file  Occupational History   Not on file  Tobacco Use   Smoking status: Former    Current packs/day: 0.00    Types: Cigarettes    Quit date: 2005    Years since quitting: 20.7   Smokeless  tobacco: Never  Vaping Use   Vaping status: Never Used  Substance and Sexual Activity   Alcohol use: Not Currently    Alcohol/week: 1.0 standard drink of alcohol    Types: 1 Cans of beer per week    Comment: social (1 beer/week)   Drug use: No   Sexual activity: Not on file  Other Topics Concern   Not on file  Social History Narrative   Not on file   Social Drivers of Health   Financial Resource Strain: Not on file  Food Insecurity: Not on file  Transportation Needs: Not on file  Physical Activity: Not on file  Stress: Not on file  Social Connections: Not on file  Intimate Partner Violence: Not on file      Review of Systems   Constitutional:  Negative for chills, fatigue and unexpected weight change.  HENT:  Negative for congestion, postnasal drip, rhinorrhea, sneezing and sore throat.   Eyes:  Negative for redness.  Respiratory:  Negative for cough, chest tightness, shortness of breath and wheezing.   Cardiovascular:  Negative for chest pain and palpitations.  Gastrointestinal:  Negative for abdominal pain, constipation, diarrhea, nausea and vomiting.  Genitourinary:  Negative for dysuria and frequency.  Musculoskeletal:  Positive for arthralgias. Negative for back pain, joint swelling and neck pain.  Skin:  Negative for rash.  Neurological: Negative.  Negative for tremors and numbness.  Hematological:  Negative for adenopathy. Does not bruise/bleed easily.  Psychiatric/Behavioral:  Negative for sleep disturbance and suicidal ideas. Behavioral problem: Depression.    Vital Signs: BP 110/80   Pulse 76   Temp 98.4 F (36.9 C)   Resp 16   Ht 5' 1.5 (1.562 m)   Wt 233 lb (105.7 kg)   SpO2 98%   BMI 43.31 kg/m    Physical Exam Vitals and nursing note reviewed.  Constitutional:      General: She is not in acute distress.    Appearance: She is well-developed. She is obese. She is not diaphoretic.  HENT:     Head: Normocephalic and atraumatic.  Eyes:     Extraocular Movements: Extraocular movements intact.  Neck:     Thyroid : No thyromegaly.     Vascular: No JVD.     Trachea: No tracheal deviation.  Cardiovascular:     Rate and Rhythm: Normal rate and regular rhythm.     Heart sounds: Normal heart sounds. No murmur heard.    No friction rub. No gallop.  Pulmonary:     Effort: Pulmonary effort is normal. No respiratory distress.     Breath sounds: No wheezing or rales.  Chest:     Chest wall: No tenderness.  Breasts:    Right: Normal. No mass.     Left: Normal. No mass.  Skin:    General: Skin is warm and dry.  Neurological:     Mental Status: She is alert and oriented to person, place,  and time.  Psychiatric:        Behavior: Behavior normal.        Thought Content: Thought content normal.        Judgment: Judgment normal.        Assessment/Plan: 1. Essential hypertension (Primary) Well controlled, continue current medication  2. Mixed hyperlipidemia Continue lipitor as before  3. Acquired hypothyroidism Continue synthroid  as before  4. Morbid obesity with BMI of 40.0-44.9, adult (HCC) Continue to work on diet and exercise   General Counseling: Ameah verbalizes understanding of the  findings of todays visit and agrees with plan of treatment. I have discussed any further diagnostic evaluation that may be needed or ordered today. We also reviewed her medications today. she has been encouraged to call the office with any questions or concerns that should arise related to todays visit.    No orders of the defined types were placed in this encounter.   No orders of the defined types were placed in this encounter.   This patient was seen by Tinnie Pro, PA-C in collaboration with Dr. Sigrid Bathe as a part of collaborative care agreement.   Total time spent:30 Minutes Time spent includes review of chart, medications, test results, and follow up plan with the patient.      Dr Fozia M Khan Internal medicine

## 2024-05-18 ENCOUNTER — Encounter: Payer: Self-pay | Admitting: Dermatology

## 2024-05-18 ENCOUNTER — Ambulatory Visit: Payer: PPO | Admitting: Dermatology

## 2024-05-18 DIAGNOSIS — L814 Other melanin hyperpigmentation: Secondary | ICD-10-CM

## 2024-05-18 DIAGNOSIS — L918 Other hypertrophic disorders of the skin: Secondary | ICD-10-CM

## 2024-05-18 DIAGNOSIS — D1801 Hemangioma of skin and subcutaneous tissue: Secondary | ICD-10-CM

## 2024-05-18 DIAGNOSIS — L820 Inflamed seborrheic keratosis: Secondary | ICD-10-CM | POA: Diagnosis not present

## 2024-05-18 DIAGNOSIS — D229 Melanocytic nevi, unspecified: Secondary | ICD-10-CM

## 2024-05-18 DIAGNOSIS — L821 Other seborrheic keratosis: Secondary | ICD-10-CM | POA: Diagnosis not present

## 2024-05-18 DIAGNOSIS — L578 Other skin changes due to chronic exposure to nonionizing radiation: Secondary | ICD-10-CM | POA: Diagnosis not present

## 2024-05-18 DIAGNOSIS — Z1283 Encounter for screening for malignant neoplasm of skin: Secondary | ICD-10-CM

## 2024-05-18 DIAGNOSIS — W908XXA Exposure to other nonionizing radiation, initial encounter: Secondary | ICD-10-CM | POA: Diagnosis not present

## 2024-05-18 NOTE — Progress Notes (Signed)
   Follow-Up Visit   Subjective  Savannah Franco is a 73 y.o. female who presents for the following: Skin Cancer Screening and Full Body Skin Exam  The patient presents for Total-Body Skin Exam (TBSE) for skin cancer screening and mole check. The patient has spots, moles and lesions to be evaluated, some may be new or changing.  No personal or fhx of skin cancer.  The following portions of the chart were reviewed this encounter and updated as appropriate: medications, allergies, medical history  Review of Systems:  No other skin or systemic complaints except as noted in HPI or Assessment and Plan.  Objective  Well appearing patient in no apparent distress; mood and affect are within normal limits.  A full examination was performed including scalp, head, eyes, ears, nose, lips, neck, chest, axillae, abdomen, back, buttocks, bilateral upper extremities, bilateral lower extremities, hands, feet, fingers, toes, fingernails, and toenails. All findings within normal limits unless otherwise noted below.   Relevant physical exam findings are noted in the Assessment and Plan.  R ear x 2, L preauricular x 1, R upper eyelid x 1, L med breast x 1, R med tricep x 1 (6) Erythematous stuck-on, waxy papule or plaque  Assessment & Plan   SKIN CANCER SCREENING PERFORMED TODAY.  ACTINIC DAMAGE - Chronic condition, secondary to cumulative UV/sun exposure - diffuse scaly erythematous macules with underlying dyspigmentation - Recommend daily broad spectrum sunscreen SPF 30+ to sun-exposed areas, reapply every 2 hours as needed.  - Staying in the shade or wearing long sleeves, sun glasses (UVA+UVB protection) and wide brim hats (4-inch brim around the entire circumference of the hat) are also recommended for sun protection.  - Call for new or changing lesions.  LENTIGINES, SEBORRHEIC KERATOSES, HEMANGIOMAS - Benign normal skin lesions - Benign-appearing - Call for any changes  MELANOCYTIC NEVI -  Tan-brown and/or pink-flesh-colored symmetric macules and papules - Benign appearing on exam today - Observation - Call clinic for new or changing moles - Recommend daily use of broad spectrum spf 30+ sunscreen to sun-exposed areas.   Acrochordons (Skin Tags) - axillary  - Fleshy, skin-colored pedunculated papules - Benign appearing.  - Observe. - If desired, they can be removed with an in office procedure that is not covered by insurance. - Please call the clinic if you notice any new or changing lesions.   INFLAMED SEBORRHEIC KERATOSIS (6) R ear x 2, L preauricular x 1, R upper eyelid x 1, L med breast x 1, R med tricep x 1 (6) Destruction of lesion - R ear x 2, L preauricular x 1, R upper eyelid x 1, L med breast x 1, R med tricep x 1 (6) Complexity: simple   Destruction method: cryotherapy   Informed consent: discussed and consent obtained   Timeout:  patient name, date of birth, surgical site, and procedure verified Lesion destroyed using liquid nitrogen: Yes   Region frozen until ice ball extended beyond lesion: Yes   Outcome: patient tolerated procedure well with no complications   Post-procedure details: wound care instructions given     Return in about 6 months (around 11/15/2024) for ISK follow up.  LILLETTE Rosina Mayans, CMA, am acting as scribe for Alm Rhyme, MD .   Documentation: I have reviewed the above documentation for accuracy and completeness, and I agree with the above.  Alm Rhyme, MD

## 2024-05-18 NOTE — Patient Instructions (Signed)

## 2024-05-22 NOTE — Progress Notes (Signed)
   05/22/2024  Patient ID: Meade KATHEE Carol, female   DOB: 12/29/1950, 73 y.o.   MRN: [id number]  Pharmacy Quality Measure Review  This patient is appearing on a report for being at risk of failing the adherence measure for cholesterol (statin) and hypertension (ACEi/ARB) medications this calendar year.   Medication: Atorvastatin  and Losartan -HCTZ Last fill date: 05/08/24 for 90 day supply  Insurance report was not up to date. No action needed at this time.   Jon VEAR Lindau, PharmD Clinical Pharmacist [phone]

## 2024-06-21 ENCOUNTER — Other Ambulatory Visit: Payer: Self-pay | Admitting: Physician Assistant

## 2024-06-21 DIAGNOSIS — M25473 Effusion, unspecified ankle: Secondary | ICD-10-CM

## 2024-08-08 ENCOUNTER — Other Ambulatory Visit: Payer: Self-pay

## 2024-08-08 ENCOUNTER — Other Ambulatory Visit: Payer: Self-pay | Admitting: Physician Assistant

## 2024-08-08 ENCOUNTER — Other Ambulatory Visit (HOSPITAL_COMMUNITY): Payer: Self-pay

## 2024-08-08 DIAGNOSIS — I10 Essential (primary) hypertension: Secondary | ICD-10-CM

## 2024-08-08 MED ORDER — LOSARTAN POTASSIUM-HCTZ 100-25 MG PO TABS
1.0000 | ORAL_TABLET | Freq: Every day | ORAL | 1 refills | Status: AC
  Filled 2024-08-08: qty 90, 90d supply, fill #0

## 2024-08-08 MED FILL — Atorvastatin Calcium Tab 40 MG (Base Equivalent): ORAL | 90 days supply | Qty: 90 | Fill #3 | Status: AC

## 2024-08-09 ENCOUNTER — Other Ambulatory Visit (HOSPITAL_COMMUNITY): Payer: Self-pay

## 2024-08-09 ENCOUNTER — Other Ambulatory Visit: Payer: Self-pay

## 2024-08-09 ENCOUNTER — Other Ambulatory Visit: Payer: Self-pay | Admitting: Physician Assistant

## 2024-08-09 DIAGNOSIS — E782 Mixed hyperlipidemia: Secondary | ICD-10-CM

## 2024-08-09 MED ORDER — ATORVASTATIN CALCIUM 40 MG PO TABS
40.0000 mg | ORAL_TABLET | Freq: Every day | ORAL | 1 refills | Status: AC
  Filled 2024-08-09: qty 90, 90d supply, fill #0

## 2024-09-06 ENCOUNTER — Telehealth: Payer: Self-pay

## 2024-09-06 NOTE — Telephone Encounter (Signed)
 Pt  called that  her Bp was high this morning was 197/84 and she check 150 82 advised her take her med as pres and  keep track of bp and make her appt tomorrow with advised is worse go to urgent care

## 2024-09-07 ENCOUNTER — Ambulatory Visit (INDEPENDENT_AMBULATORY_CARE_PROVIDER_SITE_OTHER): Admitting: Physician Assistant

## 2024-09-07 ENCOUNTER — Encounter: Payer: Self-pay | Admitting: Physician Assistant

## 2024-09-07 ENCOUNTER — Other Ambulatory Visit: Payer: Self-pay | Admitting: Physician Assistant

## 2024-09-07 VITALS — BP 149/92 | HR 84 | Temp 98.0°F | Resp 16 | Ht 61.5 in | Wt 239.0 lb

## 2024-09-07 DIAGNOSIS — I10 Essential (primary) hypertension: Secondary | ICD-10-CM | POA: Diagnosis not present

## 2024-09-07 DIAGNOSIS — Z6841 Body Mass Index (BMI) 40.0 and over, adult: Secondary | ICD-10-CM

## 2024-09-07 MED ORDER — AMLODIPINE BESYLATE 2.5 MG PO TABS: 2.5000 mg | ORAL_TABLET | Freq: Every day | ORAL | 0 refills | Status: AC

## 2024-09-07 NOTE — Progress Notes (Signed)
 " [facility] [address] [place], [place] [address]  Internal MEDICINE  Office Visit Note  Patient Name: Savannah Franco  [id number]  [id number]  Date of Service: 09/07/2024  Chief Complaint  Patient presents with   Acute Visit   Hypertension     HPI Pt is here for a sick visit. -Higher BP recently: -Tues: 150s/80 -Wed: took it many times and go tup as high as 209/106 after coming in from outside. Went down to 150/82 -This morning at home: 157/85 -Found that she has been eating chicken nuggets that have a lot of sodium--had this late Monday night around 9pm with BBQ sauce. Then had it again for lunch Tuesday  -Also a little muscle ache on left shoulder for awhile as well. Lifting bails of hay. Taking naproxen as needed, and patches -not taking any lasix  recently, just taking hyzaar  -176/94 recheck manually in office; her cuff in office:159/90, 155/85 -has gained some weight since last visit and admits to snacking more and will work on this  Current Medication:  Outpatient Encounter Medications as of 09/07/2024  Medication Sig   amLODipine  (NORVASC ) 2.5 MG tablet Take 1 tablet (2.5 mg total) by mouth daily.   aspirin 81 MG EC tablet Take 81 mg by mouth daily. Swallow whole.   atorvastatin  (LIPITOR) 40 MG tablet Take 1 tablet (40 mg total) by mouth daily.   B Complex Vitamins (VITAMIN B COMPLEX PO) Take by mouth.   cholecalciferol (VITAMIN D ) 1000 units tablet Take by mouth daily.   diclofenac  sodium (VOLTAREN ) 1 % GEL Apply 4 g topically 4 (four) times daily.   furosemide  (LASIX ) 20 MG tablet TAKE 1 TABLET BY MOUTH DAILY AS NEEDED FOR ANKLE SWELLING   levothyroxine  (SYNTHROID ) 75 MCG tablet TAKE 1 TABLET BY MOUTH DAILY BEFORE BREAKFAST   losartan -hydrochlorothiazide  (HYZAAR ) 100-25 MG tablet Take 1 tablet by mouth daily.   meclizine  (ANTIVERT ) 12.5 MG tablet Take 1 tablet (12.5 mg total) by mouth 2 (two) times daily as needed for dizziness.   Tdap (ADACEL)  12-30-13.5 LF-MCG/0.5 injection Inject 0.5 mLs into the muscle once.   No facility-administered encounter medications on file as of 09/07/2024.      Medical History: Past Medical History:  Diagnosis Date   Arthritis    right knee   Hyperlipidemia    Hypertension    Hypothyroidism    Vertigo    years ago   Wears dentures    full upper and lower     Vital Signs: BP (!) 149/92   Pulse 84   Temp 98 F (36.7 C)   Resp 16   Ht 5' 1.5 (1.562 m)   Wt 239 lb (108.4 kg)   SpO2 96%   BMI 44.43 kg/m    Review of Systems  Constitutional:  Negative for chills, fatigue and unexpected weight change.  HENT:  Negative for congestion, postnasal drip, rhinorrhea, sneezing and sore throat.   Eyes:  Negative for redness.  Respiratory:  Negative for cough, chest tightness, shortness of breath and wheezing.   Cardiovascular:  Negative for chest pain and palpitations.  Gastrointestinal:  Negative for abdominal pain, constipation, diarrhea, nausea and vomiting.  Genitourinary:  Negative for dysuria and frequency.  Musculoskeletal:  Positive for arthralgias. Negative for back pain, joint swelling and neck pain.  Skin:  Negative for rash.  Neurological: Negative.  Negative for tremors and numbness.  Hematological:  Negative for adenopathy. Does not bruise/bleed easily.  Psychiatric/Behavioral:  Negative for sleep disturbance and  suicidal ideas. Behavioral problem: Depression.    Physical Exam Vitals and nursing note reviewed.  Constitutional:      General: She is not in acute distress.    Appearance: She is well-developed. She is obese. She is not diaphoretic.  HENT:     Head: Normocephalic and atraumatic.  Eyes:     Extraocular Movements: Extraocular movements intact.  Neck:     Thyroid : No thyromegaly.     Vascular: No JVD.     Trachea: No tracheal deviation.  Cardiovascular:     Rate and Rhythm: Normal rate and regular rhythm.     Heart sounds: Normal heart sounds. No murmur  heard.    No friction rub. No gallop.  Pulmonary:     Effort: Pulmonary effort is normal. No respiratory distress.     Breath sounds: No wheezing or rales.  Chest:     Chest wall: No tenderness.  Breasts:    Right: Normal. No mass.     Left: Normal. No mass.  Skin:    General: Skin is warm and dry.  Neurological:     Mental Status: She is alert and oriented to person, place, and time.  Psychiatric:        Behavior: Behavior normal.        Thought Content: Thought content normal.        Judgment: Judgment normal.       Assessment/Plan: 1. Essential hypertension (Primary) Will add amlodipine  2.5mg  daily and monitor, may need to titrate up. Continue hyzaar . Patient also has lasix  she can use prn for swelling. Recheck in 2 weeks. Call if not improving prior or go to ED if becoming symptomatic. Will reduce sodium intake - amLODipine  (NORVASC ) 2.5 MG tablet; Take 1 tablet (2.5 mg total) by mouth daily.  Dispense: 30 tablet; Refill: 0  2. Morbid obesity with BMI of 40.0-44.9, adult (HCC) Will work on reducing sodium intake and improving diet to help wt loss and BP    General Counseling: Gerene verbalizes understanding of the findings of todays visit and agrees with plan of treatment. I have discussed any further diagnostic evaluation that may be needed or ordered today. We also reviewed her medications today. she has been encouraged to call the office with any questions or concerns that should arise related to todays visit.    Counseling:    No orders of the defined types were placed in this encounter.   Meds ordered this encounter  Medications   amLODipine  (NORVASC ) 2.5 MG tablet    Sig: Take 1 tablet (2.5 mg total) by mouth daily.    Dispense:  30 tablet    Refill:  0    Time spent:30 Minutes "

## 2024-09-14 ENCOUNTER — Ambulatory Visit: Admitting: Physician Assistant

## 2024-09-21 ENCOUNTER — Ambulatory Visit (INDEPENDENT_AMBULATORY_CARE_PROVIDER_SITE_OTHER): Admitting: Physician Assistant

## 2024-09-21 ENCOUNTER — Encounter: Payer: Self-pay | Admitting: Physician Assistant

## 2024-09-21 VITALS — BP 133/70 | HR 76 | Temp 98.0°F | Resp 16 | Ht 61.5 in | Wt 239.0 lb

## 2024-09-21 DIAGNOSIS — E559 Vitamin D deficiency, unspecified: Secondary | ICD-10-CM

## 2024-09-21 DIAGNOSIS — R5383 Other fatigue: Secondary | ICD-10-CM

## 2024-09-21 DIAGNOSIS — R7303 Prediabetes: Secondary | ICD-10-CM | POA: Diagnosis not present

## 2024-09-21 DIAGNOSIS — E039 Hypothyroidism, unspecified: Secondary | ICD-10-CM | POA: Diagnosis not present

## 2024-09-21 DIAGNOSIS — I10 Essential (primary) hypertension: Secondary | ICD-10-CM

## 2024-09-21 DIAGNOSIS — E782 Mixed hyperlipidemia: Secondary | ICD-10-CM

## 2024-09-21 NOTE — Progress Notes (Signed)
 [facility] [address] [place], [place] [address]  Internal MEDICINE  Office Visit Note  Patient Name: Savannah Franco  [id number]  [id number]  Date of Service: 09/21/2024  Chief Complaint  Patient presents with   Follow-up   Hypertension   Hyperlipidemia    HPI Pt is here for routine follow up for HTN -States amlodipine  made her loopy at first, discovered she needs to take it after eating and does ok with this. Has taken 5x in the last 2 weeks and seems to be doing ok. May be adjusting to normal BP. The lowest reading she had was 119/65 -overall feeling better with BP now and wants to continue -has been watching diet as well to try to limit salt intake -due for labs for CPE  Current Medication: Outpatient Encounter Medications as of 09/21/2024  Medication Sig   amLODipine  (NORVASC ) 2.5 MG tablet Take 1 tablet (2.5 mg total) by mouth daily.   aspirin 81 MG EC tablet Take 81 mg by mouth daily. Swallow whole.   atorvastatin  (LIPITOR) 40 MG tablet Take 1 tablet (40 mg total) by mouth daily.   B Complex Vitamins (VITAMIN B COMPLEX PO) Take by mouth.   cholecalciferol (VITAMIN D ) 1000 units tablet Take by mouth daily.   diclofenac  sodium (VOLTAREN ) 1 % GEL Apply 4 g topically 4 (four) times daily.   furosemide  (LASIX ) 20 MG tablet TAKE 1 TABLET BY MOUTH DAILY AS NEEDED FOR ANKLE SWELLING   levothyroxine  (SYNTHROID ) 75 MCG tablet TAKE 1 TABLET BY MOUTH DAILY BEFORE BREAKFAST   losartan -hydrochlorothiazide  (HYZAAR ) 100-25 MG tablet Take 1 tablet by mouth daily.   meclizine  (ANTIVERT ) 12.5 MG tablet Take 1 tablet (12.5 mg total) by mouth 2 (two) times daily as needed for dizziness.   Tdap (ADACEL) 12-30-13.5 LF-MCG/0.5 injection Inject 0.5 mLs into the muscle once.   No facility-administered encounter medications on file as of 09/21/2024.    Surgical History: Past Surgical History:  Procedure Laterality Date   BREAST CYST ASPIRATION Right    NEG   BREAST  EXCISIONAL BIOPSY Left 2002   NEG   CATARACT EXTRACTION W/PHACO Right 10/12/2019   Procedure: CATARACT EXTRACTION PHACO AND INTRAOCULAR LENS PLACEMENT (IOC) RIGHT VISION BLUE 10.81  00:58.8  17.9%;  Surgeon: Ferol Rogue, MD;  Location: [facility];  Service: Ophthalmology;  Laterality: Right;   CATARACT EXTRACTION W/PHACO Left 07/29/2021   Procedure: CATARACT EXTRACTION PHACO AND INTRAOCULAR LENS PLACEMENT (IOC) LEFT 8.19 00:49.5;  Surgeon: Jaye Fallow, MD;  Location: [facility];  Service: Ophthalmology;  Laterality: Left;   DENTAL SURGERY      Medical History: Past Medical History:  Diagnosis Date   Arthritis    right knee   Hyperlipidemia    Hypertension    Hypothyroidism    Vertigo    years ago   Wears dentures    full upper and lower    Family History: Family History  Problem Relation Age of Onset   Multiple myeloma Mother    Stroke Father    Hypertension Father    Breast cancer Neg Hx     Social History   Socioeconomic History   Marital status: Divorced    Spouse name: Not on file   Number of children: Not on file   Years of education: Not on file   Highest education level: Not on file  Occupational History   Not on file  Tobacco Use   Smoking status: Former    Current packs/day: 0.00  Types: Cigarettes    Quit date: 2005    Years since quitting: 21.0   Smokeless tobacco: Never  Vaping Use   Vaping status: Never Used  Substance and Sexual Activity   Alcohol use: Not Currently    Alcohol/week: 1.0 standard drink of alcohol    Types: 1 Cans of beer per week    Comment: social (1 beer/week)   Drug use: No   Sexual activity: Not on file  Other Topics Concern   Not on file  Social History Narrative   Not on file   Social Drivers of Health   Tobacco Use: Medium Risk (09/21/2024)   Patient History    Smoking Tobacco Use: Former    Smokeless Tobacco Use: Never    Passive Exposure: Not on Actuary Strain: Not  on file  Food Insecurity: Not on file  Transportation Needs: Not on file  Physical Activity: Not on file  Stress: Not on file  Social Connections: Not on file  Intimate Partner Violence: Not on file  Depression (PHQ2-9): Low Risk (09/21/2024)   Depression (PHQ2-9)    PHQ-2 Score: 0  Alcohol Screen: Low Risk (09/21/2024)   Alcohol Screen    Last Alcohol Screening Score (AUDIT): 1  Housing: Not on file  Utilities: Not on file  Health Literacy: Not on file      Review of Systems  Constitutional:  Negative for chills, fatigue and unexpected weight change.  HENT:  Negative for congestion, postnasal drip, rhinorrhea, sneezing and sore throat.   Eyes:  Negative for redness.  Respiratory:  Negative for cough, chest tightness, shortness of breath and wheezing.   Cardiovascular:  Negative for chest pain and palpitations.  Gastrointestinal:  Negative for abdominal pain, constipation, diarrhea, nausea and vomiting.  Genitourinary:  Negative for dysuria and frequency.  Musculoskeletal:  Positive for arthralgias. Negative for back pain, joint swelling and neck pain.  Skin:  Negative for rash.  Neurological: Negative.  Negative for tremors and numbness.  Hematological:  Negative for adenopathy. Does not bruise/bleed easily.  Psychiatric/Behavioral:  Negative for sleep disturbance and suicidal ideas. Behavioral problem: Depression.    Vital Signs: BP 133/70   Pulse 76   Temp 98 F (36.7 C)   Resp 16   Ht 5' 1.5 (1.562 m)   Wt 239 lb (108.4 kg)   SpO2 97%   BMI 44.43 kg/m    Physical Exam Vitals and nursing note reviewed.  Constitutional:      General: She is not in acute distress.    Appearance: She is well-developed. She is obese. She is not diaphoretic.  HENT:     Head: Normocephalic and atraumatic.  Eyes:     Extraocular Movements: Extraocular movements intact.  Neck:     Thyroid : No thyromegaly.     Vascular: No JVD.     Trachea: No tracheal deviation.  Cardiovascular:      Rate and Rhythm: Normal rate and regular rhythm.     Heart sounds: Normal heart sounds. No murmur heard.    No friction rub. No gallop.  Pulmonary:     Effort: Pulmonary effort is normal. No respiratory distress.     Breath sounds: No wheezing or rales.  Chest:     Chest wall: No tenderness.  Breasts:    Right: Normal. No mass.     Left: Normal. No mass.  Skin:    General: Skin is warm and dry.  Neurological:     Mental Status: She is  alert and oriented to person, place, and time.  Psychiatric:        Behavior: Behavior normal.        Thought Content: Thought content normal.        Judgment: Judgment normal.        Assessment/Plan: 1. Essential hypertension (Primary) Improved, will continue current medications, though advised to hold amlodipine  if BP dropping or if feeling loopy again. Continue to reduce salt intake  2. Prediabetes Will check labs for monitoring - Hgb A1C w/o eAG  3. Mixed hyperlipidemia Continue lipitor and will update labs - Lipid Panel With LDL/HDL Ratio  4. Acquired hypothyroidism Will update labs and adjust synthroid  as indicated - TSH + free T4  5. Vitamin D  deficiency - VITAMIN D  25 Hydroxy (Vit-D Deficiency, Fractures)  6. Other fatigue - CBC w/Diff/Platelet - Comprehensive metabolic panel with GFR - TSH + free T4 - Lipid Panel With LDL/HDL Ratio - Hgb A1C w/o eAG - VITAMIN D  25 Hydroxy (Vit-D Deficiency, Fractures)   General Counseling: Meade verbalizes understanding of the findings of todays visit and agrees with plan of treatment. I have discussed any further diagnostic evaluation that may be needed or ordered today. We also reviewed her medications today. she has been encouraged to call the office with any questions or concerns that should arise related to todays visit.    Orders Placed This Encounter  Procedures   CBC w/Diff/Platelet   Comprehensive metabolic panel with GFR   TSH + free T4   Lipid Panel With LDL/HDL  Ratio   Hgb A1C w/o eAG   VITAMIN D  25 Hydroxy (Vit-D Deficiency, Fractures)    No orders of the defined types were placed in this encounter.   This patient was seen by Tinnie Pro, PA-C in collaboration with Dr. Sigrid Bathe as a part of collaborative care agreement.   Total time spent:30 Minutes Time spent includes review of chart, medications, test results, and follow up plan with the patient.      Dr Fozia M Khan Internal medicine

## 2024-11-21 ENCOUNTER — Ambulatory Visit: Admitting: Dermatology

## 2025-01-11 ENCOUNTER — Ambulatory Visit: Admitting: Physician Assistant
